# Patient Record
Sex: Female | Born: 1995 | Race: Black or African American | Hispanic: No | Marital: Single | Smoking: Never smoker
Health system: Southern US, Community
[De-identification: ages and names within clinical notes are randomized; demographics above are authoritative.]

## PROBLEM LIST (undated history)

## (undated) DIAGNOSIS — I10 Essential (primary) hypertension: Secondary | ICD-10-CM

## (undated) DIAGNOSIS — J45909 Unspecified asthma, uncomplicated: Secondary | ICD-10-CM

## (undated) DIAGNOSIS — O24419 Gestational diabetes mellitus in pregnancy, unspecified control: Secondary | ICD-10-CM

## (undated) HISTORY — PX: NO PAST SURGERIES: SHX2092

## (undated) HISTORY — DX: Gestational diabetes mellitus in pregnancy, unspecified control: O24.419

---
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
## 2013-04-26 ENCOUNTER — Emergency Department (HOSPITAL_COMMUNITY)
Admission: EM | Admit: 2013-04-26 | Discharge: 2013-04-26 | Disposition: A | Discharge status: Home / Self Care | Payer: Medicaid Other | Source: Home / Self Care | Attending: Emergency Medicine | Admitting: Emergency Medicine

## 2013-04-26 ENCOUNTER — Emergency Department (INDEPENDENT_AMBULATORY_CARE_PROVIDER_SITE_OTHER): Payer: Medicaid Other

## 2013-04-26 ENCOUNTER — Encounter (HOSPITAL_COMMUNITY): Payer: Self-pay | Admitting: Emergency Medicine

## 2013-04-26 DIAGNOSIS — S62629A Displaced fracture of medial phalanx of unspecified finger, initial encounter for closed fracture: Secondary | ICD-10-CM

## 2013-04-26 DIAGNOSIS — IMO0002 Reserved for concepts with insufficient information to code with codable children: Secondary | ICD-10-CM
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
## 2013-04-26 NOTE — ED Notes (Signed)
C/o left middle finger swollen since Wednesday night. Stated she doesn't remember hitting it or any injury to hand/finger. Any movement causes pain. Pain is a 5/10. States she hasn't taken any medications for the pain, has tried ice with no relief. Written by: Marga Melnick

## 2014-01-08 ENCOUNTER — Encounter (HOSPITAL_COMMUNITY): Payer: Self-pay | Admitting: Family Medicine

## 2014-01-08 ENCOUNTER — Emergency Department (INDEPENDENT_AMBULATORY_CARE_PROVIDER_SITE_OTHER)
Admission: EM | Admit: 2014-01-08 | Discharge: 2014-01-08 | Disposition: A | Discharge status: Home / Self Care | Payer: Self-pay | Source: Home / Self Care | Attending: Family Medicine | Admitting: Family Medicine

## 2014-01-08 DIAGNOSIS — R51 Headache: Secondary | ICD-10-CM

## 2014-01-08 DIAGNOSIS — R519 Headache, unspecified: Secondary | ICD-10-CM

## 2014-01-08 DIAGNOSIS — T148XXA Other injury of unspecified body region, initial encounter: Secondary | ICD-10-CM

## 2014-01-08 MED ORDER — DEXAMETHASONE SODIUM PHOSPHATE 10 MG/ML IJ SOLN
INTRAMUSCULAR | Status: AC
  Filled 2014-01-08: qty 1

## 2014-01-08 MED ORDER — DEXAMETHASONE SODIUM PHOSPHATE 10 MG/ML IJ SOLN
10.0000 mg | Freq: Once | INTRAMUSCULAR | Status: AC
  Administered 2014-01-08: 10 mg via INTRAMUSCULAR

## 2014-01-08 MED ORDER — KETOROLAC TROMETHAMINE 60 MG/2ML IM SOLN
60.0000 mg | Freq: Once | INTRAMUSCULAR | Status: AC
  Administered 2014-01-08: 60 mg via INTRAMUSCULAR

## 2014-01-08 MED ORDER — CYCLOBENZAPRINE HCL 5 MG PO TABS: 5.0000 mg | ORAL_TABLET | Freq: Three times a day (TID) | ORAL | Status: DC | PRN

## 2014-01-08 MED ORDER — KETOROLAC TROMETHAMINE 60 MG/2ML IM SOLN
INTRAMUSCULAR | Status: AC
  Filled 2014-01-08: qty 2
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
## 2015-08-27 ENCOUNTER — Encounter (HOSPITAL_COMMUNITY): Payer: Self-pay | Admitting: Emergency Medicine

## 2015-08-27 ENCOUNTER — Ambulatory Visit (HOSPITAL_COMMUNITY)
Admission: EM | Admit: 2015-08-27 | Discharge: 2015-08-27 | Disposition: A | Discharge status: Home / Self Care | Payer: Medicaid Other | Attending: Emergency Medicine | Admitting: Emergency Medicine

## 2015-08-27 DIAGNOSIS — N939 Abnormal uterine and vaginal bleeding, unspecified: Secondary | ICD-10-CM | POA: Insufficient documentation

## 2015-08-27 DIAGNOSIS — N72 Inflammatory disease of cervix uteri: Secondary | ICD-10-CM | POA: Insufficient documentation

## 2015-08-27 DIAGNOSIS — N760 Acute vaginitis: Secondary | ICD-10-CM | POA: Insufficient documentation

## 2015-08-27 DIAGNOSIS — N898 Other specified noninflammatory disorders of vagina: Secondary | ICD-10-CM

## 2015-08-27 DIAGNOSIS — L309 Dermatitis, unspecified: Secondary | ICD-10-CM | POA: Insufficient documentation

## 2015-08-27 LAB — POCT URINALYSIS DIP (DEVICE)
BILIRUBIN URINE: NEGATIVE
GLUCOSE, UA: NEGATIVE mg/dL
Hgb urine dipstick: NEGATIVE
Ketones, ur: NEGATIVE mg/dL
NITRITE: NEGATIVE
PH: 7 (ref 5.0–8.0)
PROTEIN: NEGATIVE mg/dL
Specific Gravity, Urine: 1.025 (ref 1.005–1.030)
Urobilinogen, UA: 1 mg/dL (ref 0.0–1.0)

## 2015-08-27 LAB — POCT PREGNANCY, URINE: PREG TEST UR: NEGATIVE

## 2015-08-27 MED ORDER — TRIAMCINOLONE ACETONIDE 0.1 % EX CREA: 1.0000 "application " | TOPICAL_CREAM | Freq: Two times a day (BID) | CUTANEOUS | Status: DC

## 2015-08-27 MED ORDER — CEFTRIAXONE SODIUM 250 MG IJ SOLR
INTRAMUSCULAR | Status: AC
  Filled 2015-08-27: qty 250

## 2015-08-27 MED ORDER — AZITHROMYCIN 250 MG PO TABS
ORAL_TABLET | ORAL | Status: AC
  Filled 2015-08-27: qty 4

## 2015-08-27 MED ORDER — FLUTICASONE PROPIONATE 50 MCG/ACT NA SUSP: 2.0000 | Freq: Every day | NASAL | Status: DC

## 2015-08-27 MED ORDER — METRONIDAZOLE 500 MG PO TABS: 500.0000 mg | ORAL_TABLET | Freq: Two times a day (BID) | ORAL | Status: DC

## 2015-08-27 MED ORDER — BENZONATATE 100 MG PO CAPS: 100.0000 mg | ORAL_CAPSULE | Freq: Three times a day (TID) | ORAL | Status: DC

## 2015-08-27 MED ORDER — CEFTRIAXONE SODIUM 250 MG IJ SOLR
250.0000 mg | Freq: Once | INTRAMUSCULAR | Status: AC
  Administered 2015-08-27: 250 mg via INTRAMUSCULAR

## 2015-08-27 MED ORDER — AZITHROMYCIN 250 MG PO TABS
1000.0000 mg | ORAL_TABLET | Freq: Once | ORAL | Status: AC
  Administered 2015-08-27: 1000 mg via ORAL

## 2015-08-27 NOTE — ED Provider Notes (Signed)
CSN: [id number]     Arrival date & time 08/27/15  1738 History   First MD Initiated Contact with Patient 08/27/15 1933     Chief Complaint  Patient presents with  . Rash  . Vaginal Discharge  . Vaginal Bleeding   (Consider location/radiation/quality/duration/timing/severity/associated sxs/prior Treatment) HPI Comments: 20 year old female is complaining of an itchy red, rough rash over the upper eyelids and behind the ears. This started several days ago. She states while having intercourse last week that the condom slipped off and due to concern of possible pregnancy she ingested a plan B tablet 5 days ago.she is now having mild vaginal bleeding/spotting. Denies pelvic pain. Complaining of a malodorous vaginal discharge. Last menstrual period 2 weeks ago.   Past Medical History  Diagnosis Date  . Asthma    History reviewed. No pertinent past surgical history. History reviewed. No pertinent family history. Social History  Substance Use Topics  . Smoking status: Never Smoker   . Smokeless tobacco: None  . Alcohol Use: No   OB History    No data available     Review of Systems  Constitutional: Negative for fever, chills, activity change, appetite change and fatigue.  HENT: Positive for congestion, postnasal drip and rhinorrhea. Negative for facial swelling.   Eyes: Negative.   Respiratory: Negative.   Cardiovascular: Negative.   Genitourinary: Positive for vaginal bleeding, vaginal discharge and menstrual problem. Negative for dysuria, frequency and pelvic pain.  Musculoskeletal: Negative.  Negative for neck pain and neck stiffness.  Skin: Negative for pallor and rash.  Neurological: Negative.   All other systems reviewed and are negative.   Allergies  Pollen extract  Home Medications   Prior to Admission medications   Medication Sig Start Date End Date Taking? Authorizing Provider  hydrochlorothiazide (HYDRODIURIL) 12.5 MG tablet Take 12.5 mg by mouth daily.   Yes  Historical Provider, MD  levonorgestrel (PLAN B,NEXT CHOICE) 0.75 MG tablet Take 0.75 mg by mouth every 12 (twelve) hours.   Yes Historical Provider, MD  budesonide (RHINOCORT AQUA) 32 MCG/ACT nasal spray Place 1 spray into the nose 2 (two) times daily. 08/24/12   Linna HoffJames D Kindl, MD  cyclobenzaprine (FLEXERIL) 5 MG tablet Take 1 tablet (5 mg total) by mouth 3 (three) times daily as needed for muscle spasms. 01/08/14   Ozella Rocksavid J Merrell, MD  fexofenadine (ALLEGRA) 180 MG tablet Take 1 tablet (180 mg total) by mouth daily. 08/24/12   Linna HoffJames D Kindl, MD  LORATADINE PO Take by mouth.    Historical Provider, MD  metroNIDAZOLE (FLAGYL) 500 MG tablet Take 1 tablet (500 mg total) by mouth 2 (two) times daily. X 7 days 08/27/15   Hayden Rasmussenavid Yuna Pizzolato, NP  triamcinolone cream (KENALOG) 0.1 % Apply 1 application topically 2 (two) times daily. 08/27/15   Hayden Rasmussenavid Lumen Brinlee, NP   Meds Ordered and Administered this Visit   Medications  azithromycin (ZITHROMAX) tablet 1,000 mg (not administered)  cefTRIAXone (ROCEPHIN) injection 250 mg (not administered)    BP 153/89 mmHg  Pulse 94  Temp(Src) 98.4 F (36.9 C) (Oral)  Resp 16  SpO2 100%  LMP 08/06/2015 (Exact Date) No data found.   Physical Exam  Constitutional: She is oriented to person, place, and time. She appears well-developed and well-nourished. No distress.  HENT:  Bilateral TMs are normal. Oropharynx with minor erythema and clear PND.  Eyes: EOM are normal.  Neck: Normal range of motion. Neck supple.  Cardiovascular: Normal rate and normal heart sounds.   Pulmonary/Chest: Effort normal.  No respiratory distress.  Genitourinary:  Normal external female genitalia There is mild amount of bleeding coating the vaginal walls and the cervix. Ectocervix is pink and smooth. No visible lesions. No separate vaginal discharge is observed, likely masked by the bleeding. Positive for CMT and bilateral adnexal tenderness.  Musculoskeletal: She exhibits no edema.   Lymphadenopathy:    She has no cervical adenopathy.  Neurological: She is alert and oriented to person, place, and time. She exhibits normal muscle tone.  Skin: Skin is warm and dry.  There is a minor light erythema rash with a rough texture to the upper eyelids and behind the ears.  Psychiatric: She has a normal mood and affect.  Nursing note and vitals reviewed.   ED Course  Procedures (including critical care time)  Labs Review Labs Reviewed  POCT URINALYSIS DIP (DEVICE) - Abnormal; Notable for the following:    Leukocytes, UA SMALL (*)    All other components within normal limits  POCT PREGNANCY, URINE  CERVICOVAGINAL ANCILLARY ONLY   Results for orders placed or performed during the hospital encounter of 08/27/15  POCT urinalysis dip (device)  Result Value Ref Range   Glucose, UA NEGATIVE NEGATIVE mg/dL   Bilirubin Urine NEGATIVE NEGATIVE   Ketones, ur NEGATIVE NEGATIVE mg/dL   Specific Gravity, Urine 1.025 1.005 - 1.030   Hgb urine dipstick NEGATIVE NEGATIVE   pH 7.0 5.0 - 8.0   Protein, ur NEGATIVE NEGATIVE mg/dL   Urobilinogen, UA 1.0 0.0 - 1.0 mg/dL   Nitrite NEGATIVE NEGATIVE   Leukocytes, UA SMALL (A) NEGATIVE  Pregnancy, urine POC  Result Value Ref Range   Preg Test, Ur NEGATIVE NEGATIVE     Imaging Review No results found.   Visual Acuity Review  Right Eye Distance:   Left Eye Distance:   Bilateral Distance:    Right Eye Near:   Left Eye Near:    Bilateral Near:         MDM   1. Eczema   2. Vaginal discharge   3. Vaginal bleeding   4. Cervicitis   5. Vaginitis    Meds ordered this encounter  Medications  . hydrochlorothiazide (HYDRODIURIL) 12.5 MG tablet    Sig: Take 12.5 mg by mouth daily.  Marland Kitchen levonorgestrel (PLAN B,NEXT CHOICE) 0.75 MG tablet    Sig: Take 0.75 mg by mouth every 12 (twelve) hours.  Marland Kitchen azithromycin (ZITHROMAX) tablet 1,000 mg    Sig:   . cefTRIAXone (ROCEPHIN) injection 250 mg    Sig:   . metroNIDAZOLE (FLAGYL)  500 MG tablet    Sig: Take 1 tablet (500 mg total) by mouth 2 (two) times daily. X 7 days    Dispense:  14 tablet    Refill:  0    Order Specific Question:  Supervising Provider    Answer:  Charm Rings [id number]  . triamcinolone cream (KENALOG) 0.1 %    Sig: Apply 1 application topically 2 (two) times daily.    Dispense:  15 g    Refill:  0    Order Specific Question:  Supervising Provider    Answer:  Micheline Chapman       Hayden Rasmussen, NP 08/27/15 2152

## 2015-08-27 NOTE — ED Notes (Signed)
Patient advised no issues after injection.

## 2015-08-27 NOTE — Discharge Instructions (Signed)
Abnormal Uterine Bleeding The abnormal bleeding is likely due to the plan B medication you took recently. This bleeding should taper off in a few days. We have obtained swabs of the fluid in your vagina. If the test result comes back positive and you have not been previously treated at the urgent care we will call you and treat you over the telephone. For worsening or new symptoms or problems follow-up your primary care doctor or go to the [facility]. Abnormal uterine bleeding can affect women at various stages in life, including teenagers, women in their reproductive years, pregnant women, and women who have reached menopause. Several kinds of uterine bleeding are considered abnormal, including:  Bleeding or spotting between periods.   Bleeding after sexual intercourse.   Bleeding that is heavier or more than normal.   Periods that last longer than usual.  Bleeding after menopause.  Many cases of abnormal uterine bleeding are minor and simple to treat, while others are more serious. Any type of abnormal bleeding should be evaluated by your health care provider. Treatment will depend on the cause of the bleeding. HOME CARE INSTRUCTIONS Monitor your condition for any changes. The following actions may help to alleviate any discomfort you are experiencing:  Avoid the use of tampons and douches as directed by your health care provider.  Change your pads frequently. You should get regular pelvic exams and Pap tests. Keep all follow-up appointments for diagnostic tests as directed by your health care provider.  SEEK MEDICAL CARE IF:   Your bleeding lasts more than 1 week.   You feel dizzy at times.  SEEK IMMEDIATE MEDICAL CARE IF:   You pass out.   You are changing pads every 15 to 30 minutes.   You have abdominal pain.  You have a fever.   You become sweaty or weak.   You are passing large blood clots from the vagina.   You start to feel nauseous and  vomit. MAKE SURE YOU:   Understand these instructions.  Will watch your condition.  Will get help right away if you are not doing well or get worse.   This information is not intended to replace advice given to you by your health care provider. Make sure you discuss any questions you have with your health care provider.   Document Released: 04/18/2005 Document Revised: 04/23/2013 Document Reviewed: 11/15/2012 Elsevier Interactive Patient Education 2016 Elsevier Inc.  Bacterial Vaginosis Bacterial vaginosis is an infection of the vagina. It happens when too many germs (bacteria) grow in the vagina. Having this infection puts you at risk for getting other infections from sex. Treating this infection can help lower your risk for other infections, such as:   Chlamydia.  Gonorrhea.  HIV.  Herpes. HOME CARE  Take your medicine as told by your doctor.  Finish your medicine even if you start to feel better.  Tell your sex partner that you have an infection. They should see their doctor for treatment.  During treatment:  Avoid sex or use condoms correctly.  Do not douche.  Do not drink alcohol unless your doctor tells you it is ok.  Do not breastfeed unless your doctor tells you it is ok. GET HELP IF:  You are not getting better after 3 days of treatment.  You have more grey fluid (discharge) coming from your vagina than before.  You have more pain than before.  You have a fever. MAKE SURE YOU:   Understand these instructions.  Will watch  your condition.  Will get help right away if you are not doing well or get worse.   This information is not intended to replace advice given to you by your health care provider. Make sure you discuss any questions you have with your health care provider.   Document Released: 01/26/2008 Document Revised: 05/09/2014 Document Reviewed: 11/28/2012 Elsevier Interactive Patient Education 2016 Elsevier Inc.  Cervicitis Cervicitis is  a soreness and swelling (inflammation) of the cervix. Your cervix is located at the bottom of your uterus. It opens up to the vagina. CAUSES   Sexually transmitted infections (STIs).   Allergic reaction.   Medicines or birth control devices that are put in the vagina.   Injury to the cervix.   Bacterial infections.  RISK FACTORS You are at greater risk if you:  Have unprotected sexual intercourse.  Have sexual intercourse with many partners.  Began sexual intercourse at an early age.  Have a history of STIs. SYMPTOMS  There may be no symptoms. If symptoms occur, they may include:   Gray, white, yellow, or bad-smelling vaginal discharge.   Pain or itching of the area outside the vagina.   Painful sexual intercourse.   Lower abdominal or lower back pain, especially during intercourse.   Frequent urination.   Abnormal vaginal bleeding between periods, after sexual intercourse, or after menopause.   Pressure or a heavy feeling in the pelvis.  DIAGNOSIS  Diagnosis is made after a pelvic exam. Other tests may include:   Examination of any discharge under a microscope (wet prep).   A Pap test.  TREATMENT  Treatment will depend on the cause of cervicitis. If it is caused by an STI, both you and your partner will need to be treated. Antibiotic medicines will be given.  HOME CARE INSTRUCTIONS   Do not have sexual intercourse until your health care provider says it is okay.   Do not have sexual intercourse until your partner has been treated, if your cervicitis is caused by an STI.   Take your antibiotics as directed. Finish them even if you start to feel better.  SEEK MEDICAL CARE IF:  Your symptoms come back.   You have a fever.  MAKE SURE YOU:   Understand these instructions.  Will watch your condition.  Will get help right away if you are not doing well or get worse.   This information is not intended to replace advice given to you by your  health care provider. Make sure you discuss any questions you have with your health care provider.   Document Released: 04/18/2005 Document Revised: 04/23/2013 Document Reviewed: 10/10/2012 Elsevier Interactive Patient Education 2016 Elsevier Inc.  Pelvic Inflammatory Disease Pelvic inflammatory disease (PID) is an infection in some or all of the female organs. PID can be in the uterus, ovaries, fallopian tubes, or the surrounding tissues that are inside the lower belly area (pelvis). PID can lead to lasting problems if it is not treated. To check for this disease, your doctor may:  Do a physical exam.  Do blood tests, urine tests, or a pregnancy test.  Look at your vaginal discharge.  Do tests to look inside the pelvis.  Test you for other infections. HOME CARE  Take over-the-counter and prescription medicines only as told by your doctor.  If you were prescribed an antibiotic medicine, take it as told by your doctor. Do not stop taking it even if you start to feel better.  Do not have sex until treatment is done  or as told by your doctor.  Tell your sex partner if you have PID. Your partner may need to be treated.  Keep all follow-up visits as told by your doctor. This is important.  Your doctor may test you for infection again 3 months after you are treated. GET HELP IF:  You have more fluid (discharge) coming from your vagina or fluid that is not normal.  Your pain does not improve.  You throw up (vomit).  You have a fever.  You cannot take your medicines.  Your partner has a sexually transmitted disease (STD).  You have pain when you pee (urinate). GET HELP RIGHT AWAY IF:  You have more belly (abdominal) or lower belly pain.  You have chills.  You are not better after 72 hours.   This information is not intended to replace advice given to you by your health care provider. Make sure you discuss any questions you have with your health care provider.     Document Released: 07/15/2008 Document Revised: 01/07/2015 Document Reviewed: 05/26/2014 Elsevier Interactive Patient Education 2016 [employer].  Vaginitis Vaginitis is an inflammation of the vagina. It is most often caused by a change in the normal balance of the bacteria and yeast that live in the vagina. This change in balance causes an overgrowth of certain bacteria or yeast, which causes the inflammation. There are different types of vaginitis, but the most common types are:  Bacterial vaginosis.  Yeast infection (candidiasis).  Trichomoniasis vaginitis. This is a sexually transmitted infection (STI).  Viral vaginitis.  Atrophic vaginitis.  Allergic vaginitis. CAUSES  The cause depends on the type of vaginitis. Vaginitis can be caused by:  Bacteria (bacterial vaginosis).  Yeast (yeast infection).  A parasite (trichomoniasis vaginitis)  A virus (viral vaginitis).  Low hormone levels (atrophic vaginitis). Low hormone levels can occur during pregnancy, breastfeeding, or after menopause.  Irritants, such as bubble baths, scented tampons, and feminine sprays (allergic vaginitis). Other factors can change the normal balance of the yeast and bacteria that live in the vagina. These include:  Antibiotic medicines.  Poor hygiene.  Diaphragms, vaginal sponges, spermicides, birth control pills, and intrauterine devices (IUD).  Sexual intercourse.  Infection.  Uncontrolled diabetes.  A weakened immune system. SYMPTOMS  Symptoms can vary depending on the cause of the vaginitis. Common symptoms include:  Abnormal vaginal discharge.  The discharge is white, gray, or yellow with bacterial vaginosis.  The discharge is thick, white, and cheesy with a yeast infection.  The discharge is frothy and yellow or greenish with trichomoniasis.  A bad vaginal odor.  The odor is fishy with bacterial vaginosis.  Vaginal itching, pain, or swelling.  Painful  intercourse.  Pain or burning when urinating. Sometimes, there are no symptoms. TREATMENT  Treatment will vary depending on the type of infection.   Bacterial vaginosis and trichomoniasis are often treated with antibiotic creams or pills.  Yeast infections are often treated with antifungal medicines, such as vaginal creams or suppositories.  Viral vaginitis has no cure, but symptoms can be treated with medicines that relieve discomfort. Your sexual partner should be treated as well.  Atrophic vaginitis may be treated with an estrogen cream, pill, suppository, or vaginal ring. If vaginal dryness occurs, lubricants and moisturizing creams may help. You may be told to avoid scented soaps, sprays, or douches.  Allergic vaginitis treatment involves quitting the use of the product that is causing the problem. Vaginal creams can be used to treat the symptoms. HOME CARE INSTRUCTIONS  Take all medicines as directed by your caregiver.  Keep your genital area clean and dry. Avoid soap and only rinse the area with water.  Avoid douching. It can remove the healthy bacteria in the vagina.  Do not use tampons or have sexual intercourse until your vaginitis has been treated. Use sanitary pads while you have vaginitis.  Wipe from front to back. This avoids the spread of bacteria from the rectum to the vagina.  Let air reach your genital area.  Wear cotton underwear to decrease moisture buildup.  Avoid wearing underwear while you sleep until your vaginitis is gone.  Avoid tight pants and underwear or nylons without a cotton panel.  Take off wet clothing (especially bathing suits) as soon as possible.  Use mild, non-scented products. Avoid using irritants, such as:  Scented feminine sprays.  Fabric softeners.  Scented detergents.  Scented tampons.  Scented soaps or bubble baths.  Practice safe sex and use condoms. Condoms may prevent the spread of trichomoniasis and viral  vaginitis. SEEK MEDICAL CARE IF:   You have abdominal pain.  You have a fever or persistent symptoms for more than 2-3 days.  You have a fever and your symptoms suddenly get worse.   This information is not intended to replace advice given to you by your health care provider. Make sure you discuss any questions you have with your health care provider.   Document Released: 02/13/2007 Document Revised: 09/02/2014 Document Reviewed: 09/29/2011 Elsevier Interactive Patient Education [employer]2016 Elsevier Inc.

## 2015-08-27 NOTE — ED Notes (Signed)
Patient placed in the lobby for a 20 minute observation after injection.

## 2015-08-27 NOTE — ED Notes (Addendum)
[facility] with a complaint of a rash behind her ears that she thinks to be eczema and a vaginal discharge with odor and some spotting x 1 month. The patient did state that she took a Plan B pill 4 days ago.

## 2015-08-28 LAB — CERVICOVAGINAL ANCILLARY ONLY
Chlamydia: NEGATIVE
Neisseria Gonorrhea: NEGATIVE

## 2015-08-31 LAB — CERVICOVAGINAL ANCILLARY ONLY: WET PREP (BD AFFIRM): POSITIVE — AB

## 2015-09-05 ENCOUNTER — Telehealth (HOSPITAL_COMMUNITY): Payer: Self-pay | Admitting: Emergency Medicine

## 2015-09-05 NOTE — ED Notes (Signed)
Called pt and notified of recent lab results from visit 04/27 Pt ID'd properly... Reports feeling better and sx have subsided... She's on her cycle but states she feels fine  Has finished Flagyl   Per Dr. Dayton ScrapeMurray,  Tests for gonorrhea/chlamydia were negative. Test for gardnerella (bacterial vaginosis) was positive.  [facility]  Recheck for further evaluation if symptoms persist. LM  Adv pt if sx are not getting better to return  Pt verb understanding Education on safe sex given

## 2016-01-27 ENCOUNTER — Encounter: Payer: Self-pay | Admitting: Obstetrics and Gynecology

## 2016-01-27 ENCOUNTER — Ambulatory Visit (INDEPENDENT_AMBULATORY_CARE_PROVIDER_SITE_OTHER): Payer: BLUE CROSS/BLUE SHIELD | Admitting: Obstetrics and Gynecology

## 2016-01-27 VITALS — BP 148/91 | HR 101 | Temp 99.6°F | Ht 64.0 in | Wt 177.2 lb

## 2016-01-27 DIAGNOSIS — Z01419 Encounter for gynecological examination (general) (routine) without abnormal findings: Secondary | ICD-10-CM

## 2016-01-27 NOTE — Progress Notes (Signed)
Patient is in office concerned about discharge that has odor that comes and goes. Patient states that it burns a little when she pees.

## 2016-01-27 NOTE — Progress Notes (Signed)
Subjective:     Tara Chase is a 20 y.o. female G0 who is here for a comprehensive physical exam. The patient reports the presence of a vaginal odor a few weeks ago. She is sexually active using condoms occasionally. She is undecided on contraception but is considering Mirena IUD. She reports regular five day cycles. She denies any abdominal pain or abnormal discharge  Past Medical History:  Diagnosis Date  . Asthma    No past surgical history on file. No family history on file.  Social History   Social History  . Marital status: Single    Spouse name: N/A  . Number of children: N/A  . Years of education: N/A   Occupational History  . Not on file.   Social History Main Topics  . Smoking status: Never Smoker  . Smokeless tobacco: Not on file  . Alcohol use No  . Drug use: No  . Sexual activity: Not on file   Other Topics Concern  . Not on file   Social History Narrative  . No narrative on file   Health Maintenance  Topic Date Due  . HIV Screening  04/15/2009  . PAP SMEAR  04/16/2015  . INFLUENZA VACCINE  12/01/2015  . TETANUS/TDAP  05/25/2022       Review of Systems Pertinent items are noted in HPI.   Objective:  Blood pressure (!) 148/91, pulse (!) 101, temperature 99.6 F (37.6 C), temperature source Oral, height 5\' 4"  (1.626 m), weight 177 lb 3.2 oz (80.4 kg), last menstrual period 01/15/2016.     GENERAL: Well-developed, well-nourished female in no acute distress.  HEENT: Normocephalic, atraumatic. Sclerae anicteric.  NECK: Supple. Normal thyroid.  LUNGS: Clear to auscultation bilaterally.  HEART: Regular rate and rhythm. BREASTS: Symmetric in size. No palpable masses or lymphadenopathy, skin changes, or nipple drainage. ABDOMEN: Soft, nontender, nondistended. No organomegaly. PELVIC: Normal external female genitalia. Vagina is pink and rugated.  Normal discharge. Normal appearing cervix. Uterus is normal in size.  No adnexal mass or  tenderness. EXTREMITIES: No cyanosis, clubbing, or edema, 2+ distal pulses.    Assessment:    Healthy female exam.      Plan:    pap smear collected Cultures and wet prep collected Patient will be contacted with any abnormal results Encouraged the use of condoms for STD prevention See After Visit Summary for Counseling Recommendations

## 2016-01-30 LAB — NUSWAB VG+, CANDIDA 6SP
CANDIDA ALBICANS, NAA: POSITIVE — AB
CANDIDA PARAPSILOSIS, NAA: NEGATIVE
CHLAMYDIA TRACHOMATIS, NAA: NEGATIVE
Candida glabrata, NAA: NEGATIVE
Candida krusei, NAA: NEGATIVE
Candida lusitaniae, NAA: NEGATIVE
Candida tropicalis, NAA: NEGATIVE
NEISSERIA GONORRHOEAE, NAA: NEGATIVE
TRICH VAG BY NAA: POSITIVE — AB

## 2016-02-01 MED ORDER — METRONIDAZOLE 500 MG PO TABS: 500.0000 mg | ORAL_TABLET | Freq: Two times a day (BID) | ORAL | 0 refills | Status: AC

## 2016-02-01 NOTE — Addendum Note (Signed)
Addended by: Catalina AntiguaONSTANT, Mirella Gueye on: 02/01/2016 09:38 AM   Modules accepted: Orders

## 2016-02-02 ENCOUNTER — Encounter: Payer: Self-pay | Admitting: Obstetrics and Gynecology

## 2016-02-02 DIAGNOSIS — R87612 Low grade squamous intraepithelial lesion on cytologic smear of cervix (LGSIL): Secondary | ICD-10-CM | POA: Insufficient documentation

## 2016-02-02 LAB — PAP IG W/ RFLX HPV ASCU: PAP SMEAR COMMENT: 0

## 2016-02-03 ENCOUNTER — Telehealth: Payer: Self-pay | Admitting: *Deleted

## 2016-02-03 NOTE — Telephone Encounter (Signed)
-----   Message from Catalina AntiguaPeggy Constant, MD sent at 02/01/2016  9:38 AM EDT ----- Please inform patient of BV and trich infection. Rx has been e-prescribed Her partner needs to be informed and treated for Trich infection. They should both abstain until they both complete treatment  Thanks  Peggy

## 2016-02-03 NOTE — Telephone Encounter (Signed)
Tara AntiguaPeggy Constant, MD  [facility]        Please inform patient of abnormal pap smear. No interventions are needed this year but she needs to have another pap smear next year.   If not already received, encourage her to get the gardasil vaccine   Thanks   Peggy    Left message for patient to call for results

## 2016-02-03 NOTE — Telephone Encounter (Signed)
Patient notified of her results 

## 2016-02-16 ENCOUNTER — Ambulatory Visit: Payer: Self-pay | Admitting: Certified Nurse Midwife

## 2016-07-22 ENCOUNTER — Emergency Department (HOSPITAL_COMMUNITY): Payer: BLUE CROSS/BLUE SHIELD

## 2016-07-22 ENCOUNTER — Encounter (HOSPITAL_COMMUNITY): Payer: Self-pay | Admitting: Emergency Medicine

## 2016-07-22 ENCOUNTER — Emergency Department (HOSPITAL_COMMUNITY)
Admission: EM | Admit: 2016-07-22 | Discharge: 2016-07-22 | Disposition: A | Discharge status: Home / Self Care | Payer: BLUE CROSS/BLUE SHIELD | Attending: Emergency Medicine | Admitting: Emergency Medicine

## 2016-07-22 DIAGNOSIS — R1030 Lower abdominal pain, unspecified: Secondary | ICD-10-CM | POA: Insufficient documentation

## 2016-07-22 DIAGNOSIS — R0602 Shortness of breath: Secondary | ICD-10-CM | POA: Diagnosis not present

## 2016-07-22 DIAGNOSIS — Z79899 Other long term (current) drug therapy: Secondary | ICD-10-CM | POA: Insufficient documentation

## 2016-07-22 DIAGNOSIS — J45909 Unspecified asthma, uncomplicated: Secondary | ICD-10-CM | POA: Insufficient documentation

## 2016-07-22 DIAGNOSIS — R109 Unspecified abdominal pain: Secondary | ICD-10-CM

## 2016-07-22 DIAGNOSIS — I10 Essential (primary) hypertension: Secondary | ICD-10-CM | POA: Insufficient documentation

## 2016-07-22 HISTORY — DX: Essential (primary) hypertension: I10

## 2016-07-22 LAB — CBC
HCT: 39.3 % (ref 36.0–46.0)
HEMOGLOBIN: 13.1 g/dL (ref 12.0–15.0)
MCH: 31.1 pg (ref 26.0–34.0)
MCHC: 33.3 g/dL (ref 30.0–36.0)
MCV: 93.3 fL (ref 78.0–100.0)
Platelets: 288 10*3/uL (ref 150–400)
RBC: 4.21 MIL/uL (ref 3.87–5.11)
RDW: 12.6 % (ref 11.5–15.5)
WBC: 9.5 10*3/uL (ref 4.0–10.5)

## 2016-07-22 LAB — COMPREHENSIVE METABOLIC PANEL
ALT: 13 U/L — ABNORMAL LOW (ref 14–54)
ANION GAP: 5 (ref 5–15)
AST: 19 U/L (ref 15–41)
Albumin: 4.1 g/dL (ref 3.5–5.0)
Alkaline Phosphatase: 74 U/L (ref 38–126)
BUN: 11 mg/dL (ref 6–20)
CALCIUM: 9.2 mg/dL (ref 8.9–10.3)
CHLORIDE: 106 mmol/L (ref 101–111)
CO2: 26 mmol/L (ref 22–32)
Creatinine, Ser: 0.86 mg/dL (ref 0.44–1.00)
GFR calc Af Amer: >60 mL/min (ref >60)
Glucose, Bld: 91 mg/dL (ref 65–99)
Potassium: 3.7 mmol/L (ref 3.5–5.1)
SODIUM: 137 mmol/L (ref 135–145)
Total Bilirubin: 0.7 mg/dL (ref 0.3–1.2)
Total Protein: 8.2 g/dL — ABNORMAL HIGH (ref 6.5–8.1)

## 2016-07-22 LAB — URINALYSIS, ROUTINE W REFLEX MICROSCOPIC
Bilirubin Urine: NEGATIVE
GLUCOSE, UA: NEGATIVE mg/dL
KETONES UR: NEGATIVE mg/dL
LEUKOCYTES UA: NEGATIVE
Nitrite: NEGATIVE
PROTEIN: NEGATIVE mg/dL
Specific Gravity, Urine: 1.015 (ref 1.005–1.030)
pH: 7 (ref 5.0–8.0)

## 2016-07-22 LAB — LIPASE, BLOOD: LIPASE: 19 U/L (ref 11–51)

## 2016-07-22 LAB — PROTIME-INR
INR: 1.03
Prothrombin Time: 13.5 seconds (ref 11.4–15.2)

## 2016-07-22 LAB — I-STAT BETA HCG BLOOD, ED (MC, WL, AP ONLY)

## 2016-07-22 LAB — I-STAT TROPONIN, ED: TROPONIN I, POC: 0 ng/mL (ref 0.00–0.08)

## 2016-07-22 LAB — D-DIMER, QUANTITATIVE: D-Dimer, Quant: <0.27 ug/mL-FEU (ref 0.00–0.50)

## 2016-07-22 LAB — I-STAT CG4 LACTIC ACID, ED: Lactic Acid, Venous: 0.69 mmol/L (ref 0.5–1.9)

## 2016-07-22 MED ORDER — HYDROCODONE-ACETAMINOPHEN 5-325 MG PO TABS
1.0000 | ORAL_TABLET | Freq: Once | ORAL | Status: AC
  Administered 2016-07-22: 1 via ORAL
  Filled 2016-07-22: qty 1

## 2016-07-22 MED ORDER — HYDROCODONE-ACETAMINOPHEN 5-325 MG PO TABS: 1.0000 | ORAL_TABLET | Freq: Four times a day (QID) | ORAL | 0 refills | Status: DC | PRN

## 2016-07-22 NOTE — ED Triage Notes (Signed)
Patient reports shortness of breath since January after a upper respiratory infection along with a productive cough with white/clear mucous. Patient also reports menstrual period irregularity, accompanied by lower abdominal cramping.

## 2016-07-22 NOTE — ED Provider Notes (Signed)
[facility] Provider Note   CSN: [id number] Arrival date & time: 07/22/16  1057     History   Chief Complaint Chief Complaint  Patient presents with  . URI  . Abdominal Pain    HPI Tara Chase is a 21 y.o. female with a past medical history significant for hypertension and asthma who presents with a two-month history of mild shortness of breath, a productive cough for the last week, and some lower abdominal cramping associated with menstrual cycle. Patient reports that for the last 2 months, since January, she was having some shortness of breath. She is unsure if it is related to seasonal changes or allergies. She says that she has had no chest pain. She says over the last week or so, she has had worsening productive cough that has had a yellow/clear sputum. She denies fevers or chills with this. She reports that her cough has been waking her up occasionally. She says that she has had some lower abdominal cramping across her lower abdomen. There is no nausea, vomiting, conservation, diarrhea or dysuria. She says that she has had irregular menstrual cycles for the last few months and she switched off of her birth control medications. She says that the cramping is similar to prior menstrual cycle cramping and she denies any vaginal discharge. She has no history of STI. She has no pelvic pain. She denies flank pain or CVA pain. She denies any other symptoms on arrival. She has no history of family CAD and no history of DVT or PE in herself. No lower extremity pain or swelling.     The history is provided by the patient. No language interpreter was used.  Shortness of Breath  This is a new problem. The average episode lasts 2 months. The problem occurs intermittently.The problem has not changed since onset.Associated symptoms include rhinorrhea, cough, sputum production and abdominal pain. Pertinent negatives include no fever, no headaches, no neck pain, no hemoptysis, no wheezing,  no chest pain, no syncope, no vomiting, no rash, no leg pain and no leg swelling. She has tried beta-agonist inhalers for the symptoms. The treatment provided moderate relief. Associated medical issues include asthma. Associated medical issues do not include PE, CAD, heart failure, past MI or DVT.    Past Medical History:  Diagnosis Date  . Asthma   . Hypertension     Patient Active Problem List   Diagnosis Date Noted  . Low grade squamous intraepithelial lesion (LGSIL) on cervical Pap smear 02/02/2016    History reviewed. No pertinent surgical history.  OB History    No data available       Home Medications    Prior to Admission medications   Medication Sig Start Date End Date Taking? Authorizing Provider  albuterol (PROVENTIL HFA;VENTOLIN HFA) 108 (90 Base) MCG/ACT inhaler Inhale 1-2 puffs into the lungs every 6 (six) hours as needed for wheezing or shortness of breath.   Yes Historical Provider, MD  Biotin 5 MG TABS Take 1 tablet by mouth daily.   Yes Historical Provider, MD  lisinopril-hydrochlorothiazide (PRINZIDE,ZESTORETIC) 20-12.5 MG tablet Take 1 tablet by mouth daily.   Yes Historical Provider, MD  Multiple Vitamin (MULTIVITAMIN WITH MINERALS) TABS tablet Take 1 tablet by mouth daily.   Yes Historical Provider, MD    Family History Family History  Problem Relation Age of Onset  . Cancer Paternal Grandfather   . Hypertension Father   . Stroke Father   . Hypertension Mother     Social  History Social History  Substance Use Topics  . Smoking status: Never Smoker  . Smokeless tobacco: Never Used  . Alcohol use No     Allergies   Pollen extract   Review of Systems Review of Systems  Constitutional: Negative for activity change, chills, diaphoresis, fatigue and fever.  HENT: Positive for rhinorrhea. Negative for congestion.   Eyes: Negative for visual disturbance.  Respiratory: Positive for cough, sputum production and shortness of breath. Negative for  hemoptysis, wheezing and stridor.   Cardiovascular: Negative for chest pain, palpitations, leg swelling and syncope.  Gastrointestinal: Positive for abdominal pain. Negative for abdominal distention, blood in stool, constipation, diarrhea, nausea and vomiting.  Genitourinary: Positive for vaginal bleeding (on menstrual cycle). Negative for difficulty urinating, dysuria, flank pain, frequency, hematuria, menstrual problem, pelvic pain, urgency and vaginal discharge.  Musculoskeletal: Negative for back pain and neck pain.  Skin: Negative for rash and wound.  Neurological: Negative for dizziness, weakness, light-headedness, numbness and headaches.  Psychiatric/Behavioral: Negative for agitation and confusion.  All other systems reviewed and are negative.    Physical Exam Updated Vital Signs BP (!) 156/86 (BP Location: Right Arm)   Pulse 94   Temp 98.9 F (37.2 C) (Oral)   Resp 12   Ht 5\' 5"  (1.651 m)   Wt 180 lb (81.6 kg)   LMP 07/22/2016   SpO2 100%   BMI 29.95 kg/m   Physical Exam  Constitutional: She is oriented to person, place, and time. She appears well-developed and well-nourished. No distress.  HENT:  Head: Normocephalic and atraumatic.  Right Ear: External ear normal.  Left Ear: External ear normal.  Nose: Nose normal.  Mouth/Throat: Oropharynx is clear and moist. No oropharyngeal exudate.  Eyes: Conjunctivae and EOM are normal. Pupils are equal, round, and reactive to light.  Neck: Normal range of motion. Neck supple.  Cardiovascular: Normal rate, normal heart sounds and intact distal pulses.   No murmur heard. Pulmonary/Chest: Effort normal and breath sounds normal. No stridor. No respiratory distress. She has no wheezes. She has no rales. She exhibits no tenderness.  Abdominal: Soft. She exhibits no distension. There is no tenderness. There is no rebound.  Genitourinary:  Genitourinary Comments: Pelvic exam offered and pt refused.   Neurological: She is alert and  oriented to person, place, and time. She has normal reflexes. She exhibits normal muscle tone. Coordination normal.  Skin: Skin is warm. Capillary refill takes less than 2 seconds. No rash noted. She is not diaphoretic. No erythema.  Psychiatric: She has a normal mood and affect.  Nursing note and vitals reviewed.    ED Treatments / Results  Labs (all labs ordered are listed, but only abnormal results are displayed) Labs Reviewed  COMPREHENSIVE METABOLIC PANEL - Abnormal; Notable for the following:       Result Value   Total Protein 8.2 (*)    ALT 13 (*)    All other components within normal limits  URINALYSIS, ROUTINE W REFLEX MICROSCOPIC - Abnormal; Notable for the following:    Hgb urine dipstick MODERATE (*)    Bacteria, UA RARE (*)    Squamous Epithelial / LPF 0-5 (*)    All other components within normal limits  LIPASE, BLOOD  CBC  PROTIME-INR  D-DIMER, QUANTITATIVE (NOT AT [facility])  I-STAT BETA HCG BLOOD, ED ([facility], [facility], [facility] ONLY)  I-STAT CG4 LACTIC ACID, ED  I-STAT TROPOININ, ED  I-STAT CG4 LACTIC ACID, ED    EKG  EKG Interpretation  Date/Time:  Friday  July 22 2016 13:01:19 EDT Ventricular Rate:  70 PR Interval:    QRS Duration: 98 QT Interval:  385 QTC Calculation: 416 R Axis:   86 Text Interpretation:  Sinus rhythm No STEMI Confirmed by Rush Landmark MD, Walda Hertzog [phone]) on 07/22/2016 3:12:17 PM       Radiology Dg Chest 2 View  Result Date: 07/22/2016 CLINICAL DATA:  Cough for 2 months.  Hypertension. EXAM: CHEST  2 VIEW COMPARISON:  None. FINDINGS: Lungs are clear. Heart size and pulmonary vascularity are normal. No adenopathy. No bone lesions. IMPRESSION: No edema or consolidation. Electronically Signed   By: Bretta Bang III M.D.   On: 07/22/2016 12:56    Procedures Procedures (including critical care time)  Medications Ordered in ED Medications  HYDROcodone-acetaminophen (NORCO/VICODIN) 5-325 MG per tablet 1 tablet (1 tablet Oral Given 07/22/16 1329)      Initial Impression / Assessment and Plan / ED Course  I have reviewed the triage vital signs and the nursing notes.  Pertinent labs & imaging results that were available during my care of the patient were reviewed by me and considered in my medical decision making (see chart for details).     Tara Chase is a 22 y.o. female with a past medical history significant for hypertension and asthma who presents with a two-month history of mild shortness of breath, a productive cough for the last week, and some lower abdominal cramping associated with menstrual cycle.  History and exam are seen above.   On exam, patient has no lower extremity tenderness or swelling. No abdominal tenderness on exam. Specifically, no right lower quadrant tenderness. No CVA tenderness. Lungs are clear with no wheezing. Patient reports using her albuterol inhaler prior to coming to the ED. She has no chest tenderness.  Based on patient's report of shortness of breath with cough, x-ray obtained to look for pneumonia. No evidence of infection was seen. With the shortness of breath she has been having and her recent change in birth control, d-dimer was ordered to look for DVT or PE. D-dimer negative. Doubt embolus. Lactic acid unremarkable, troponin negative, and INR is normal. Urinalysis showed no evidence of UTI and patient had no urinary symptoms. Patient is not pregnant. Lipase normal.   Chest x-ray shows no evidence of pneumonia. EKG showed no evidence of acute ischemia.  Patient was counseled that lower abdominal cramping and pain in the abdomen could be worked up with pelvic exam and swabs however, she did not want to do this at this time. Patient was given a pain pill which improved her discomfort and it was completely resolved.   Suspect patient's cramping is from her menstrual cycle that is very irregular due to medication changes. Suspect the patient's asthma had flared up causing her shortness of breath.  URI likely the cause of her cough. No evidence of pneumonia.  Patient will be given prescription for pain medication and instructed to follow-up with her PCP in the next several days. Patient understood strict return precautions for new or worsening symptoms. Do not feel patient has a cardiac etiology of her discomfort. Patient had no other questions or concerns and was discharged in good condition with resolution of abdominal pain and shortness of breath.    Final Clinical Impressions(s) / ED Diagnoses   Final diagnoses:  Shortness of breath  Abdominal cramping    New Prescriptions Discharge Medication List as of 07/22/2016  3:47 PM    START taking these medications   Details  HYDROcodone-acetaminophen (  NORCO/VICODIN) 5-325 MG tablet Take 1 tablet by mouth every 6 (six) hours as needed., Starting Fri 07/22/2016, Print        Clinical Impression: 1. Shortness of breath   2. Abdominal cramping     Disposition: Discharge  Condition: Good  I have discussed the results, Dx and Tx plan with the pt(& family if present). He/she/they expressed understanding and agree(s) with the plan. Discharge instructions discussed at great length. Strict return precautions discussed and pt &/or family have verbalized understanding of the instructions. No further questions at time of discharge.    Discharge Medication List as of 07/22/2016  3:47 PM    START taking these medications   Details  HYDROcodone-acetaminophen (NORCO/VICODIN) 5-325 MG tablet Take 1 tablet by mouth every 6 (six) hours as needed., Starting Fri 07/22/2016, Print        [facility] [address] [place] [id number] [phone]       Heide Scaleshristopher J Latravis Grine, MD 07/22/16 2014

## 2016-07-22 NOTE — Discharge Instructions (Signed)
Please schedule an appointment to follow-up with your primary care physician in the next several days for recheck of your discomfort. These take pain medicine as needed for the discomfort. If he began having a new or worsened symptoms, please return to the nearest emergency department.

## 2017-11-30 ENCOUNTER — Encounter (HOSPITAL_COMMUNITY): Payer: Self-pay | Admitting: *Deleted

## 2017-11-30 ENCOUNTER — Ambulatory Visit (HOSPITAL_COMMUNITY)
Admission: EM | Admit: 2017-11-30 | Discharge: 2017-11-30 | Disposition: A | Discharge status: Home / Self Care | Payer: BLUE CROSS/BLUE SHIELD | Attending: Family Medicine | Admitting: Family Medicine

## 2017-11-30 ENCOUNTER — Ambulatory Visit (INDEPENDENT_AMBULATORY_CARE_PROVIDER_SITE_OTHER): Payer: BLUE CROSS/BLUE SHIELD

## 2017-11-30 ENCOUNTER — Other Ambulatory Visit: Payer: Self-pay

## 2017-11-30 DIAGNOSIS — M7989 Other specified soft tissue disorders: Secondary | ICD-10-CM

## 2017-11-30 MED ORDER — NAPROXEN 500 MG PO TABS: 500.0000 mg | ORAL_TABLET | Freq: Two times a day (BID) | ORAL | 0 refills | Status: DC

## 2017-11-30 NOTE — ED Provider Notes (Signed)
[facility]    CSN: [id number] Arrival date & time: 11/30/17  1705     History   Chief Complaint Chief Complaint  Patient presents with  . Hand Pain    HPI Tara Chase is a 22 y.o. female.   Tara Chase presents with complaints of pain and swelling to mtp joints of right hand to pointer and middle finger. Noticed yesterday. No known injury. She is right handed. States has happened in the past, has been months, and resolved with wrapping it. Has not wrapped it. Pain 7/10. Worse with touch or movement. Tried applying ice as well as biofreeze. No numbness or tingling. No history of gout or kidney stones. Does take lisinopril/hctz.    ROS per HPI.      Past Medical History:  Diagnosis Date  . Asthma   . Hypertension     Patient Active Problem List   Diagnosis Date Noted  . Low grade squamous intraepithelial lesion (LGSIL) on cervical Pap smear 02/02/2016    History reviewed. No pertinent surgical history.  OB History   None      Home Medications    Prior to Admission medications   Medication Sig Start Date End Date Taking? Authorizing Provider  albuterol (PROVENTIL HFA;VENTOLIN HFA) 108 (90 Base) MCG/ACT inhaler Inhale 1-2 puffs into the lungs every 6 (six) hours as needed for wheezing or shortness of breath.    [provider]  Biotin 5 MG TABS Take 1 tablet by mouth daily.    [provider]  HYDROcodone-acetaminophen (NORCO/VICODIN) 5-325 MG tablet Take 1 tablet by mouth every 6 (six) hours as needed. 07/22/16   Tegeler, Canary Brimhristopher J, MD  lisinopril-hydrochlorothiazide (PRINZIDE,ZESTORETIC) 20-12.5 MG tablet Take 1 tablet by mouth daily.    [provider]  Multiple Vitamin (MULTIVITAMIN WITH MINERALS) TABS tablet Take 1 tablet by mouth daily.    [provider]  naproxen (NAPROSYN) 500 MG tablet Take 1 tablet (500 mg total) by mouth 2 (two) times daily. 11/30/17   Georgetta HaberBurky, Maksym Pfiffner B, NP    Family History Family  History  Problem Relation Age of Onset  . Cancer Paternal Grandfather   . Hypertension Father   . Stroke Father   . Hypertension Mother     Social History Social History   Tobacco Use  . Smoking status: Never Smoker  . Smokeless tobacco: Never Used  Substance Use Topics  . Alcohol use: No  . Drug use: No     Allergies   Pollen extract   Review of Systems Review of Systems   Physical Exam Triage Vital Signs ED Triage Vitals  Enc Vitals Group     BP 11/30/17 1722 (!) 158/96     Pulse Rate 11/30/17 1722 (!) 102     Resp 11/30/17 1722 14     Temp 11/30/17 1722 98.8 F (37.1 C)     Temp Source 11/30/17 1722 Oral     SpO2 11/30/17 1722 95 %     Weight --      Height --      Head Circumference --      Peak Flow --      Pain Score 11/30/17 1723 7     Pain Loc --      Pain Edu? --      Excl. in GC? --    No data found.  Updated Vital Signs BP (!) 158/96 (BP Location: Right Arm)   Pulse (!) 102   Temp 98.8 F (37.1  C) (Oral)   Resp 14   LMP 11/19/2017 (Approximate)   SpO2 95%    Physical Exam  Constitutional: She is oriented to person, place, and time. She appears well-developed and well-nourished. No distress.  Cardiovascular: Normal rate, regular rhythm and normal heart sounds.  Pulmonary/Chest: Effort normal and breath sounds normal.  Musculoskeletal:       Right wrist: Normal.       Right hand: She exhibits tenderness, bony tenderness and swelling. She exhibits normal range of motion, normal two-point discrimination, normal capillary refill, no deformity and no laceration. Normal sensation noted. Normal strength noted.       Hands: Slightly red and swollen with tenderness to pointer and ring MTP joints with pain with ROM; sensation intact, strong radial pulse; cap refill < 2 seconds ; no pain to other fingers or hand, no skin breakdown or lesions, no rash; wrist WNL   Neurological: She is alert and oriented to person, place, and time.  Skin: Skin is  warm and dry.     UC Treatments / Results  Labs (all labs ordered are listed, but only abnormal results are displayed) Labs Reviewed - No data to display  EKG None  Radiology Dg Hand Complete Right  Result Date: 11/30/2017 CLINICAL DATA:  Pain and swelling to the second digit EXAM: RIGHT HAND - COMPLETE 3+ VIEW COMPARISON:  None. FINDINGS: There is no evidence of fracture or dislocation. There is no evidence of arthropathy or other focal bone abnormality. Soft tissues are unremarkable. IMPRESSION: Negative. Electronically Signed   By: Jasmine Pang M.D.   On: 11/30/2017 17:57    Procedures Procedures (including critical care time)  Medications Ordered in UC Medications - No data to display  Initial Impression / Assessment and Plan / UC Course  I have reviewed the triage vital signs and the nursing notes.  Pertinent labs & imaging results that were available during my care of the patient were reviewed by me and considered in my medical decision making (see chart for details).     Xray reassuring today. No known injury. No indication of infection at this time. Appears inflammatory in nature. Gout vs arthritis considered. Will treat with naproxen, ice, elevate. May use ACE as needed. Encouraged establish with PCP as may need further evaluation into symptoms if they continue to recur. Return precautions provided. Patient verbalized understanding and agreeable to plan.    Final Clinical Impressions(s) / UC Diagnoses   Final diagnoses:  Swelling of right hand     Discharge Instructions     Xray is normal today.  Question arthritis type inflammatory process.  Will try naproxen twice a day, take with food, to help with pain and swelling.  Ice application.  May use a wrap as needed for comfort.  Please establish with a primary care provider as may need further evaluation or recheck if symptoms continue to recur.     ED Prescriptions    Medication Sig Dispense Auth. Provider     naproxen (NAPROSYN) 500 MG tablet Take 1 tablet (500 mg total) by mouth 2 (two) times daily. 30 tablet Georgetta Haber, NP     Controlled Substance Prescriptions [place] Controlled Substance Registry consulted? Not Applicable   Georgetta Haber, NP 11/30/17 [phone]

## 2017-11-30 NOTE — Discharge Instructions (Signed)
Xray is normal today.  Question arthritis type inflammatory process.  Will try naproxen twice a day, take with food, to help with pain and swelling.  Ice application.  May use a wrap as needed for comfort.  Please establish with a primary care provider as may need further evaluation or recheck if symptoms continue to recur.

## 2017-11-30 NOTE — ED Triage Notes (Signed)
C/o pain and swelling in right hand onset yest. Denies injury

## 2019-02-13 ENCOUNTER — Emergency Department (HOSPITAL_COMMUNITY)
Admission: EM | Admit: 2019-02-13 | Discharge: 2019-02-13 | Disposition: A | Discharge status: Home / Self Care | Payer: No Typology Code available for payment source | Attending: Emergency Medicine | Admitting: Emergency Medicine

## 2019-02-13 ENCOUNTER — Other Ambulatory Visit: Payer: Self-pay

## 2019-02-13 ENCOUNTER — Emergency Department (HOSPITAL_COMMUNITY): Payer: No Typology Code available for payment source

## 2019-02-13 ENCOUNTER — Encounter (HOSPITAL_COMMUNITY): Payer: Self-pay

## 2019-02-13 DIAGNOSIS — Z79899 Other long term (current) drug therapy: Secondary | ICD-10-CM | POA: Insufficient documentation

## 2019-02-13 DIAGNOSIS — R109 Unspecified abdominal pain: Secondary | ICD-10-CM | POA: Insufficient documentation

## 2019-02-13 DIAGNOSIS — I10 Essential (primary) hypertension: Secondary | ICD-10-CM | POA: Insufficient documentation

## 2019-02-13 DIAGNOSIS — J45909 Unspecified asthma, uncomplicated: Secondary | ICD-10-CM | POA: Insufficient documentation

## 2019-02-13 LAB — I-STAT BETA HCG BLOOD, ED (MC, WL, AP ONLY): I-stat hCG, quantitative: <5 m[IU]/mL (ref <5)

## 2019-02-13 LAB — I-STAT CHEM 8, ED
BUN: 12 mg/dL (ref 6–20)
Calcium, Ion: 1.17 mmol/L (ref 1.15–1.40)
Chloride: 103 mmol/L (ref 98–111)
Creatinine, Ser: 0.9 mg/dL (ref 0.44–1.00)
Glucose, Bld: 95 mg/dL (ref 70–99)
HCT: 40 % (ref 36.0–46.0)
Hemoglobin: 13.6 g/dL (ref 12.0–15.0)
Potassium: 3.6 mmol/L (ref 3.5–5.1)
Sodium: 142 mmol/L (ref 135–145)
TCO2: 25 mmol/L (ref 22–32)

## 2019-02-13 LAB — CBC WITH DIFFERENTIAL/PLATELET
Abs Immature Granulocytes: 0.04 10*3/uL (ref 0.00–0.07)
Basophils Absolute: 0.1 10*3/uL (ref 0.0–0.1)
Basophils Relative: 1 %
Eosinophils Absolute: 0.1 10*3/uL (ref 0.0–0.5)
Eosinophils Relative: 1 %
HCT: 39.3 % (ref 36.0–46.0)
Hemoglobin: 12.8 g/dL (ref 12.0–15.0)
Immature Granulocytes: 0 %
Lymphocytes Relative: 28 %
Lymphs Abs: 3.7 10*3/uL (ref 0.7–4.0)
MCH: 30.9 pg (ref 26.0–34.0)
MCHC: 32.6 g/dL (ref 30.0–36.0)
MCV: 94.9 fL (ref 80.0–100.0)
Monocytes Absolute: 1.1 10*3/uL — ABNORMAL HIGH (ref 0.1–1.0)
Monocytes Relative: 8 %
Neutro Abs: 8.4 10*3/uL — ABNORMAL HIGH (ref 1.7–7.7)
Neutrophils Relative %: 62 %
Platelets: 301 10*3/uL (ref 150–400)
RBC: 4.14 MIL/uL (ref 3.87–5.11)
RDW: 12.5 % (ref 11.5–15.5)
WBC: 13.5 10*3/uL — ABNORMAL HIGH (ref 4.0–10.5)
nRBC: 0 % (ref 0.0–0.2)

## 2019-02-13 LAB — RAPID URINE DRUG SCREEN, HOSP PERFORMED
Amphetamines: NOT DETECTED
Barbiturates: NOT DETECTED
Benzodiazepines: NOT DETECTED
Cocaine: NOT DETECTED
Opiates: NOT DETECTED
Tetrahydrocannabinol: NOT DETECTED

## 2019-02-13 MED ORDER — IOHEXOL 300 MG/ML  SOLN
100.0000 mL | Freq: Once | INTRAMUSCULAR | Status: AC | PRN
  Administered 2019-02-13: 100 mL via INTRAVENOUS

## 2019-02-13 MED ORDER — SODIUM CHLORIDE (PF) 0.9 % IJ SOLN
INTRAMUSCULAR | Status: AC
  Filled 2019-02-13: qty 50

## 2019-02-13 MED ORDER — METHOCARBAMOL 500 MG PO TABS: 500.0000 mg | ORAL_TABLET | Freq: Two times a day (BID) | ORAL | 0 refills | Status: DC

## 2019-02-13 MED ORDER — ACETAMINOPHEN 500 MG PO TABS
1000.0000 mg | ORAL_TABLET | Freq: Once | ORAL | Status: AC
  Administered 2019-02-13: 1000 mg via ORAL
  Filled 2019-02-13: qty 2

## 2019-02-13 MED ORDER — IBUPROFEN 800 MG PO TABS: 800.0000 mg | ORAL_TABLET | Freq: Three times a day (TID) | ORAL | 0 refills | Status: DC

## 2019-02-13 MED ORDER — ALUM & MAG HYDROXIDE-SIMETH 200-200-20 MG/5ML PO SUSP
30.0000 mL | Freq: Once | ORAL | Status: AC
  Administered 2019-02-13: 30 mL via ORAL
  Filled 2019-02-13: qty 30

## 2019-02-13 MED ORDER — KETOROLAC TROMETHAMINE 30 MG/ML IJ SOLN
15.0000 mg | Freq: Once | INTRAMUSCULAR | Status: AC
  Administered 2019-02-13: 15 mg via INTRAVENOUS
  Filled 2019-02-13: qty 1
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
## 2019-05-06 ENCOUNTER — Ambulatory Visit: Payer: BLUE CROSS/BLUE SHIELD | Admitting: Women's Health

## 2019-05-14 ENCOUNTER — Other Ambulatory Visit: Payer: Self-pay

## 2019-05-14 ENCOUNTER — Ambulatory Visit (INDEPENDENT_AMBULATORY_CARE_PROVIDER_SITE_OTHER): Payer: 59 | Admitting: Women's Health

## 2019-05-14 ENCOUNTER — Encounter: Payer: Self-pay | Admitting: Women's Health

## 2019-05-14 VITALS — BP 130/82 | Ht 64.0 in | Wt 179.0 lb

## 2019-05-14 DIAGNOSIS — Z01419 Encounter for gynecological examination (general) (routine) without abnormal findings: Secondary | ICD-10-CM | POA: Diagnosis not present

## 2019-05-14 DIAGNOSIS — Z113 Encounter for screening for infections with a predominantly sexual mode of transmission: Secondary | ICD-10-CM

## 2019-05-14 DIAGNOSIS — N898 Other specified noninflammatory disorders of vagina: Secondary | ICD-10-CM | POA: Diagnosis not present

## 2019-05-14 LAB — WET PREP FOR TRICH, YEAST, CLUE

## 2019-05-14 MED ORDER — METRONIDAZOLE 500 MG PO TABS: 500.0000 mg | ORAL_TABLET | Freq: Two times a day (BID) | ORAL | 0 refills | Status: DC

## 2019-05-14 NOTE — Progress Notes (Signed)
Tara Chase Jul 12, 1995 [id number]    History:  24 yo SBF G0 presents for annual exam with mild vaginal itching x1 week. Regular monthly 5 day cycles. New partner encounter in October 2020 with no contraception, requests STD screen. Pap 2017 LGSIL without previous office follow-up.. Interested in weight neutral contraceptive. No Gardasil. Medical problems include HTN, pre-diabetes, asthma managed by PCP.   Past medical history, past surgical history, family history and social history were all reviewed and documented in the EPIC chart.  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  Mother, father, sister have HTN. Grandmother has diabetes.   ROS:  A ROS was performed and pertinent positives and negatives are included.  Exam:  Vitals:   05/14/19 1545  BP: 130/82  Weight: 179 lb (81.2 kg)  Height: 5\' 4"  (1.626 m)   Body mass index is 30.73 kg/m.   General appearance:  Normal Thyroid:  Symmetrical, normal in size, without palpable masses or nodularity. Respiratory  Auscultation:  Clear without wheezing or rhonchi Cardiovascular  Auscultation:  Regular rate, without rubs, murmurs or gallops  Edema/varicosities:  Not grossly evident Abdominal  Soft,nontender, without masses, guarding or rebound.  Liver/spleen:  No organomegaly noted  Hernia:  None appreciated  Skin  Inspection:  Grossly normal   Breasts: Examined lying and sitting.     Right: Without masses, retractions, discharge or axillary adenopathy.     Left: Without masses, retractions, discharge or axillary adenopathy. Gentitourinary   Inguinal/mons:  Normal without inguinal adenopathy  External genitalia:  Normal  BUS/Urethra/Skene's glands:  Normal  Vagina:  Scant discharge. Wet prep positive for clue cells and TNTC bacteria.   Cervix:  Normal  Uterus:  normal in size, shape and contour.  Midline and mobile  Adnexa/parametria:     Rt: Without masses or tenderness.   Lt: Without masses or tenderness.  Anus  and perineum: Normal    Assessment/Plan:  24 y.o. SBF G0  for annual exam and mild vaginal irritation/itching.  Regular monthly cycle-No contraception STD screen  HTN, pre-diabetes, asthma- management/labs with PCP Bacterial Vaginosis  Plan: Metronidazole 500mg  BID x7 days, alcohol precautions reviewed.  Instructed to call if continued problems with discharge.  Pap, GC/chlamydia, RPR, HIV. Reviewed contraceptive options,  if decides to proceed with Mirena IUD will call to schedule while on cycle.  IUD risks of infection, perforation, hemorrhage reviewed.  Informed about Gardasil, will consider for next visit. Aware of need to increase cardio exercise, decrease calories/carbs, DASH diet. Encouraged MVI and SBE.   22 [facility], 4:50 PM 05/14/2019

## 2019-05-14 NOTE — Patient Instructions (Signed)
Nice to meet you! MVI daily Human Papillomavirus Quadrivalent Vaccine suspension for injection What is this medicine? HUMAN PAPILLOMAVIRUS VACCINE (HYOO muhn pap uh LOH muh vahy ruhs vak SEEN) is a vaccine. It is used to prevent infections of four types of the human papillomavirus. In women, the vaccine may lower your risk of getting cervical, vaginal, vulvar, or anal cancer and genital warts. In men, the vaccine may lower your risk of getting genital warts and anal cancer. You cannot get these diseases from the vaccine. This vaccine does not treat these diseases. This medicine may be used for other purposes; ask your health care provider or pharmacist if you have questions. COMMON BRAND NAME(S): Gardasil What should I tell my health care provider before I take this medicine? They need to know if you have any of these conditions:  fever or infection  hemophilia  HIV infection or AIDS  immune system problems  low platelet count  an unusual reaction to Human Papillomavirus Vaccine, yeast, other medicines, foods, dyes, or preservatives  pregnant or trying to get pregnant  breast-feeding How should I use this medicine? This vaccine is for injection in a muscle on your upper arm or thigh. It is given by a health care professional. Dennis Bast will be observed for 15 minutes after each dose. Sometimes, fainting happens after the vaccine is given. You may be asked to sit or lie down during the 15 minutes. Three doses are given. The second dose is given 2 months after the first dose. The last dose is given 4 months after the second dose. A copy of a Vaccine Information Statement will be given before each vaccination. Read this sheet carefully each time. The sheet may change frequently. Talk to your pediatrician regarding the use of this medicine in children. While this drug may be prescribed for children as Dezhane Staten as 26 years of age for selected conditions, precautions do apply. Overdosage: If you think  you have taken too much of this medicine contact a poison control center or emergency room at once. NOTE: This medicine is only for you. Do not share this medicine with others. What if I miss a dose? All 3 doses of the vaccine should be given within 6 months. Remember to keep appointments for follow-up doses. Your health care provider will tell you when to return for the next vaccine. Ask your health care professional for advice if you are unable to keep an appointment or miss a scheduled dose. What may interact with this medicine?  other vaccines This list may not describe all possible interactions. Give your health care provider a list of all the medicines, herbs, non-prescription drugs, or dietary supplements you use. Also tell them if you smoke, drink alcohol, or use illegal drugs. Some items may interact with your medicine. What should I watch for while using this medicine? This vaccine may not fully protect everyone. Continue to have regular pelvic exams and cervical or anal cancer screenings as directed by your doctor. The Human Papillomavirus is a sexually transmitted disease. It can be passed by any kind of sexual activity that involves genital contact. The vaccine works best when given before you have any contact with the virus. Many people who have the virus do not have any signs or symptoms. Tell your doctor or health care professional if you have any reaction or unusual symptom after getting the vaccine. What side effects may I notice from receiving this medicine? Side effects that you should report to your doctor or health  care professional as soon as possible:  allergic reactions like skin rash, itching or hives, swelling of the face, lips, or tongue  breathing problems  feeling faint or lightheaded, falls Side effects that usually do not require medical attention (report to your doctor or health care professional if they continue or are  bothersome):  cough  dizziness  fever  headache  nausea  redness, warmth, swelling, pain, or itching at site where injected This list may not describe all possible side effects. Call your doctor for medical advice about side effects. You may report side effects to FDA at 1-800-FDA-1088. Where should I keep my medicine? This drug is given in a hospital or clinic and will not be stored at home. NOTE: This sheet is a summary. It may not cover all possible information. If you have questions about this medicine, talk to your doctor, pharmacist, or health care provider.  2020 Elsevier/Gold Standard (2013-06-10 13:14:33)  DASH Eating Plan DASH stands for "Dietary Approaches to Stop Hypertension." The DASH eating plan is a healthy eating plan that has been shown to reduce high blood pressure (hypertension). It may also reduce your risk for type 2 diabetes, heart disease, and stroke. The DASH eating plan may also help with weight loss. What are tips for following this plan?  General guidelines  Avoid eating more than 2,300 mg (milligrams) of salt (sodium) a day. If you have hypertension, you may need to reduce your sodium intake to 1,500 mg a day.  Limit alcohol intake to no more than 1 drink a day for nonpregnant women and 2 drinks a day for men. One drink equals 12 oz of beer, 5 oz of wine, or 1 oz of hard liquor.  Work with your health care provider to maintain a healthy body weight or to lose weight. Ask what an ideal weight is for you.  Get at least 30 minutes of exercise that causes your heart to beat faster (aerobic exercise) most days of the week. Activities may include walking, swimming, or biking.  Work with your health care provider or diet and nutrition specialist (dietitian) to adjust your eating plan to your individual calorie needs. Reading food labels   Check food labels for the amount of sodium per serving. Choose foods with less than 5 percent of the Daily Value of  sodium. Generally, foods with less than 300 mg of sodium per serving fit into this eating plan.  To find whole grains, look for the word "whole" as the first word in the ingredient list. Shopping  Buy products labeled as "low-sodium" or "no salt added."  Buy fresh foods. Avoid canned foods and premade or frozen meals. Cooking  Avoid adding salt when cooking. Use salt-free seasonings or herbs instead of table salt or sea salt. Check with your health care provider or pharmacist before using salt substitutes.  Do not fry foods. Cook foods using healthy methods such as baking, boiling, grilling, and broiling instead.  Cook with heart-healthy oils, such as olive, canola, soybean, or sunflower oil. Meal planning  Eat a balanced diet that includes: ? 5 or more servings of fruits and vegetables each day. At each meal, try to fill half of your plate with fruits and vegetables. ? Up to 6-8 servings of whole grains each day. ? Less than 6 oz of lean meat, poultry, or fish each day. A 3-oz serving of meat is about the same size as a deck of cards. One egg equals 1 oz. ? 2 servings of  low-fat dairy each day. ? A serving of nuts, seeds, or beans 5 times each week. ? Heart-healthy fats. Healthy fats called Omega-3 fatty acids are found in foods such as flaxseeds and coldwater fish, like sardines, salmon, and mackerel.  Limit how much you eat of the following: ? Canned or prepackaged foods. ? Food that is high in trans fat, such as fried foods. ? Food that is high in saturated fat, such as fatty meat. ? Sweets, desserts, sugary drinks, and other foods with added sugar. ? Full-fat dairy products.  Do not salt foods before eating.  Try to eat at least 2 vegetarian meals each week.  Eat more home-cooked food and less restaurant, buffet, and fast food.  When eating at a restaurant, ask that your food be prepared with less salt or no salt, if possible. What foods are recommended? The items listed  may not be a complete list. Talk with your dietitian about what dietary choices are best for you. Grains Whole-grain or whole-wheat bread. Whole-grain or whole-wheat pasta. Brown rice. [place] Morrow. Bulgur. Whole-grain and low-sodium cereals. Pita bread. Low-fat, low-sodium crackers. Whole-wheat flour tortillas. Vegetables Fresh or frozen vegetables (raw, steamed, roasted, or grilled). Low-sodium or reduced-sodium tomato and vegetable juice. Low-sodium or reduced-sodium tomato sauce and tomato paste. Low-sodium or reduced-sodium canned vegetables. Fruits All fresh, dried, or frozen fruit. Canned fruit in natural juice (without added sugar). Meat and other protein foods Skinless chicken or [place]. Ground chicken or [place]. Pork with fat trimmed off. Fish and seafood. Egg whites. Dried beans, peas, or lentils. Unsalted nuts, nut butters, and seeds. Unsalted canned beans. Lean cuts of beef with fat trimmed off. Low-sodium, lean deli meat. Dairy Low-fat (1%) or fat-free (skim) milk. Fat-free, low-fat, or reduced-fat cheeses. Nonfat, low-sodium ricotta or cottage cheese. Low-fat or nonfat yogurt. Low-fat, low-sodium cheese. Fats and oils Soft margarine without trans fats. Vegetable oil. Low-fat, reduced-fat, or light mayonnaise and salad dressings (reduced-sodium). Canola, safflower, olive, soybean, and sunflower oils. Avocado. Seasoning and other foods Herbs. Spices. Seasoning mixes without salt. Unsalted popcorn and pretzels. Fat-free sweets. What foods are not recommended? The items listed may not be a complete list. Talk with your dietitian about what dietary choices are best for you. Grains Baked goods made with fat, such as croissants, muffins, or some breads. Dry pasta or rice meal packs. Vegetables Creamed or fried vegetables. Vegetables in a cheese sauce. Regular canned vegetables (not low-sodium or reduced-sodium). Regular canned tomato sauce and paste (not low-sodium or reduced-sodium).  Regular tomato and vegetable juice (not low-sodium or reduced-sodium). Angie Fava. Olives. Fruits Canned fruit in a light or heavy syrup. Fried fruit. Fruit in cream or butter sauce. Meat and other protein foods Fatty cuts of meat. Ribs. Fried meat. Berniece Salines. Sausage. Bologna and other processed lunch meats. Salami. Fatback. Hotdogs. Bratwurst. Salted nuts and seeds. Canned beans with added salt. Canned or smoked fish. Whole eggs or egg yolks. Chicken or [place] with skin. Dairy Whole or 2% milk, cream, and half-and-half. Whole or full-fat cream cheese. Whole-fat or sweetened yogurt. Full-fat cheese. Nondairy creamers. Whipped toppings. Processed cheese and cheese spreads. Fats and oils Butter. Stick margarine. Lard. Shortening. Ghee. Bacon fat. Tropical oils, such as coconut, palm kernel, or palm oil. Seasoning and other foods Salted popcorn and pretzels. Onion salt, garlic salt, seasoned salt, table salt, and sea salt. Worcestershire sauce. Tartar sauce. Barbecue sauce. Teriyaki sauce. Soy sauce, including reduced-sodium. Steak sauce. Canned and packaged gravies. Fish sauce. Oyster sauce. Cocktail sauce. Horseradish that you  find on the shelf. Ketchup. Mustard. Meat flavorings and tenderizers. Bouillon cubes. Hot sauce and Tabasco sauce. Premade or packaged marinades. Premade or packaged taco seasonings. Relishes. Regular salad dressings. Where to find more information:  National Heart, Lung, and [place]: https://wilson-eaton.com/  American Heart Association: www.heart.org Summary  The DASH eating plan is a healthy eating plan that has been shown to reduce high blood pressure (hypertension). It may also reduce your risk for type 2 diabetes, heart disease, and stroke.  With the DASH eating plan, you should limit salt (sodium) intake to 2,300 mg a day. If you have hypertension, you may need to reduce your sodium intake to 1,500 mg a day.  When on the DASH eating plan, aim to eat more fresh fruits and  vegetables, whole grains, lean proteins, low-fat dairy, and heart-healthy fats.  Work with your health care provider or diet and nutrition specialist (dietitian) to adjust your eating plan to your individual calorie needs. This information is not intended to replace advice given to you by your health care provider. Make sure you discuss any questions you have with your health care provider. Document Revised: 03/31/2017 Document Reviewed: 04/11/2016 Elsevier Patient Education  2020 [place] Maintenance, Female Adopting a healthy lifestyle and getting preventive care are important in promoting health and wellness. Ask your health care provider about:  The right schedule for you to have regular tests and exams.  Things you can do on your own to prevent diseases and keep yourself healthy. What should I know about diet, weight, and exercise? Eat a healthy diet   Eat a diet that includes plenty of vegetables, fruits, low-fat dairy products, and lean protein.  Do not eat a lot of foods that are high in solid fats, added sugars, or sodium. Maintain a healthy weight Body mass index (BMI) is used to identify weight problems. It estimates body fat based on height and weight. Your health care provider can help determine your BMI and help you achieve or maintain a healthy weight. Get regular exercise Get regular exercise. This is one of the most important things you can do for your health. Most adults should:  Exercise for at least 150 minutes each week. The exercise should increase your heart rate and make you sweat (moderate-intensity exercise).  Do strengthening exercises at least twice a week. This is in addition to the moderate-intensity exercise.  Spend less time sitting. Even light physical activity can be beneficial. Watch cholesterol and blood lipids Have your blood tested for lipids and cholesterol at 26 years of age, then have this test every 5 years. Have your cholesterol  levels checked more often if:  Your lipid or cholesterol levels are high.  You are older than 26 years of age.  You are at high risk for heart disease. What should I know about cancer screening? Depending on your health history and family history, you may need to have cancer screening at various ages. This may include screening for:  Breast cancer.  Cervical cancer.  Colorectal cancer.  Skin cancer.  Lung cancer. What should I know about heart disease, diabetes, and high blood pressure? Blood pressure and heart disease  High blood pressure causes heart disease and increases the risk of stroke. This is more likely to develop in people who have high blood pressure readings, are of African descent, or are overweight.  Have your blood pressure checked: ? Every 3-5 years if you are 4-60 years of age. ? Every year if you are  37 years old or older. Diabetes Have regular diabetes screenings. This checks your fasting blood sugar level. Have the screening done:  Once every three years after age 32 if you are at a normal weight and have a low risk for diabetes.  More often and at a younger age if you are overweight or have a high risk for diabetes. What should I know about preventing infection? Hepatitis B If you have a higher risk for hepatitis B, you should be screened for this virus. Talk with your health care provider to find out if you are at risk for hepatitis B infection. Hepatitis C Testing is recommended for:  Everyone born from [birth year] through 1967.  Anyone with known risk factors for hepatitis C. Sexually transmitted infections (STIs)  Get screened for STIs, including gonorrhea and chlamydia, if: ? You are sexually active and are younger than 26 years of age. ? You are older than 26 years of age and your health care provider tells you that you are at risk for this type of infection. ? Your sexual activity has changed since you were last screened, and you are at increased  risk for chlamydia or gonorrhea. Ask your health care provider if you are at risk.  Ask your health care provider about whether you are at high risk for HIV. Your health care provider may recommend a prescription medicine to help prevent HIV infection. If you choose to take medicine to prevent HIV, you should first get tested for HIV. You should then be tested every 3 months for as long as you are taking the medicine. Pregnancy  If you are about to stop having your period (premenopausal) and you may become pregnant, seek counseling before you get pregnant.  Take 400 to 800 micrograms (mcg) of folic acid every day if you become pregnant.  Ask for birth control (contraception) if you want to prevent pregnancy. Osteoporosis and menopause Osteoporosis is a disease in which the bones lose minerals and strength with aging. This can result in bone fractures. If you are 84 years old or older, or if you are at risk for osteoporosis and fractures, ask your health care provider if you should:  Be screened for bone loss.  Take a calcium or vitamin D supplement to lower your risk of fractures.  Be given hormone replacement therapy (HRT) to treat symptoms of menopause. Follow these instructions at home: Lifestyle  Do not use any products that contain nicotine or tobacco, such as cigarettes, e-cigarettes, and chewing tobacco. If you need help quitting, ask your health care provider.  Do not use street drugs.  Do not share needles.  Ask your health care provider for help if you need support or information about quitting drugs. Alcohol use  Do not drink alcohol if: ? Your health care provider tells you not to drink. ? You are pregnant, may be pregnant, or are planning to become pregnant.  If you drink alcohol: ? Limit how much you use to 0-1 drink a day. ? Limit intake if you are breastfeeding.  Be aware of how much alcohol is in your drink. In the [place] one drink equals one 12 oz bottle of beer  (355 mL), one 5 oz glass of wine (148 mL), or one 1 oz glass of hard liquor (44 mL). General instructions  Schedule regular health, dental, and eye exams.  Stay current with your vaccines.  Tell your health care provider if: ? You often feel depressed. ? You have ever been abused  or do not feel safe at home. Summary  Adopting a healthy lifestyle and getting preventive care are important in promoting health and wellness.  Follow your health care provider's instructions about healthy diet, exercising, and getting tested or screened for diseases.  Follow your health care provider's instructions on monitoring your cholesterol and blood pressure. This information is not intended to replace advice given to you by your health care provider. Make sure you discuss any questions you have with your health care provider. Document Revised: 04/11/2018 Document Reviewed: 04/11/2018 Elsevier Patient Education  2020 [employer].

## 2019-05-15 LAB — PAP THINPREP ASCUS RFLX HPV RFLX TYPE
C. trachomatis RNA, TMA: NOT DETECTED
N. gonorrhoeae RNA, TMA: NOT DETECTED

## 2019-05-15 LAB — HIV ANTIBODY (ROUTINE TESTING W REFLEX): HIV 1&2 Ab, 4th Generation: NONREACTIVE

## 2019-05-15 LAB — RPR: RPR Ser Ql: NONREACTIVE

## 2019-06-10 ENCOUNTER — Ambulatory Visit: Payer: 59

## 2019-06-12 ENCOUNTER — Ambulatory Visit (INDEPENDENT_AMBULATORY_CARE_PROVIDER_SITE_OTHER): Payer: 59 | Admitting: *Deleted

## 2019-06-12 ENCOUNTER — Other Ambulatory Visit: Payer: Self-pay

## 2019-06-12 DIAGNOSIS — Z23 Encounter for immunization: Secondary | ICD-10-CM

## 2019-06-22 ENCOUNTER — Other Ambulatory Visit: Payer: Self-pay | Admitting: Women's Health

## 2019-07-11 ENCOUNTER — Other Ambulatory Visit: Payer: Self-pay

## 2019-07-12 ENCOUNTER — Encounter: Payer: Self-pay | Admitting: Obstetrics & Gynecology

## 2019-07-12 ENCOUNTER — Ambulatory Visit (INDEPENDENT_AMBULATORY_CARE_PROVIDER_SITE_OTHER): Payer: 59 | Admitting: Obstetrics & Gynecology

## 2019-07-12 VITALS — BP 134/86

## 2019-07-12 DIAGNOSIS — Z113 Encounter for screening for infections with a predominantly sexual mode of transmission: Secondary | ICD-10-CM

## 2019-07-12 DIAGNOSIS — Z3009 Encounter for other general counseling and advice on contraception: Secondary | ICD-10-CM

## 2019-07-12 DIAGNOSIS — Z7251 High risk heterosexual behavior: Secondary | ICD-10-CM | POA: Diagnosis not present

## 2019-07-12 DIAGNOSIS — N898 Other specified noninflammatory disorders of vagina: Secondary | ICD-10-CM | POA: Diagnosis not present

## 2019-07-12 LAB — WET PREP FOR TRICH, YEAST, CLUE

## 2019-07-12 MED ORDER — TINIDAZOLE 500 MG PO TABS: 1.0000 g | ORAL_TABLET | Freq: Two times a day (BID) | ORAL | 0 refills | Status: AC

## 2019-07-12 NOTE — Progress Notes (Signed)
    RHYDER BRATZ 01/01/1996 [id number]        24 y.o.  G0P0000 Boyfriend x 6 months  RP: Increased vaginal discharge and unprotected IC  HPI: Boyfriend x 6 months, not trustworthy.  Unprotected IC.  Increased vaginal d/c with odor.  No itching.  No pelvic pain.  No fever.  Patient would like full STI w-up.  LMP normal 06/30/2019.     OB History  Gravida Para Term Preterm AB Living  0 0 0 0 0 0  SAB TAB Ectopic Multiple Live Births  0 0 0 0 0    Past medical history,surgical history, problem list, medications, allergies, family history and social history were all reviewed and documented in the EPIC chart.   Directed ROS with pertinent positives and negatives documented in the history of present illness/assessment and plan.  Exam:  Vitals:   07/12/19 1040  BP: 134/86   General appearance:  Normal  Abdomen: Normal  Gynecologic exam: Vulva normal.  Speculum:  Cervix/Vagina normal.  Mild increase in discharge.  Wet prep done.  Gono-Chlam done.  Bimanual exam:  Uterus AV, normal volume, mobile, NT.  Wet prep:  Clue cells present   Assessment/Plan:  24 y.o. G0  1. Unprotected sexual intercourse Last menstrual period normal June 30, 2019.  Serum pregnancy test done today. - hCG, serum, qualitative  2. Vaginal discharge Bacterial vaginosis confirmed by wet prep.  Decision to treat with tinidazole.  No contraindication.  Usage reviewed and prescription sent to pharmacy. - WET PREP FOR TRICH, YEAST, CLUE  3. Screen for STD (sexually transmitted disease) Strict condom use strongly recommended. - HIV antibody (with reflex) - RPR - Hepatitis C Antibody - Hepatitis B Surface AntiGEN - C. trachomatis/N. gonorrhoeae RNA  4. Encounter for other general counseling or advice on contraception Counseling on contraception done.  Would like the progesterone IUD.  Follow-up with insertion.  Other orders - tinidazole (TINDAMAX) 500 MG tablet; Take 2 tablets (1,000 mg total)  by mouth 2 (two) times daily for 2 days.  Genia Del MD, 10:46 AM 07/12/2019

## 2019-07-13 ENCOUNTER — Encounter: Payer: Self-pay | Admitting: Obstetrics & Gynecology

## 2019-07-13 LAB — C. TRACHOMATIS/N. GONORRHOEAE RNA
C. trachomatis RNA, TMA: NOT DETECTED
N. gonorrhoeae RNA, TMA: NOT DETECTED

## 2019-07-13 NOTE — Patient Instructions (Signed)
1. Unprotected sexual intercourse Last menstrual period normal June 30, 2019.  Serum pregnancy test done today. - hCG, serum, qualitative  2. Vaginal discharge Bacterial vaginosis confirmed by wet prep.  Decision to treat with tinidazole.  No contraindication.  Usage reviewed and prescription sent to pharmacy. - WET PREP FOR TRICH, YEAST, CLUE  3. Screen for STD (sexually transmitted disease) Strict condom use strongly recommended. - HIV antibody (with reflex) - RPR - Hepatitis C Antibody - Hepatitis B Surface AntiGEN - C. trachomatis/N. gonorrhoeae RNA  4. Encounter for other general counseling or advice on contraception Counseling on contraception done.  Would like the progesterone IUD.  Follow-up with insertion.  Other orders - tinidazole (TINDAMAX) 500 MG tablet; Take 2 tablets (1,000 mg total) by mouth 2 (two) times daily for 2 days.  Tara Chase, it was a pleasure seeing you today!  I will inform you of your results as soon as they are available.

## 2019-07-15 LAB — HIV ANTIBODY (ROUTINE TESTING W REFLEX): HIV 1&2 Ab, 4th Generation: NONREACTIVE

## 2019-07-15 LAB — HCG, SERUM, QUALITATIVE: Preg, Serum: NEGATIVE

## 2019-07-15 LAB — HEPATITIS C ANTIBODY
Hepatitis C Ab: NONREACTIVE
SIGNAL TO CUT-OFF: 0.03 (ref <1.00)

## 2019-07-15 LAB — RPR: RPR Ser Ql: NONREACTIVE

## 2019-07-15 LAB — HEPATITIS B SURFACE ANTIGEN: Hepatitis B Surface Ag: NONREACTIVE

## 2019-08-12 ENCOUNTER — Ambulatory Visit: Payer: 59 | Admitting: *Deleted

## 2019-08-12 ENCOUNTER — Other Ambulatory Visit: Payer: Self-pay

## 2019-08-12 DIAGNOSIS — Z23 Encounter for immunization: Secondary | ICD-10-CM

## 2019-10-14 ENCOUNTER — Encounter: Payer: Self-pay | Admitting: Nurse Practitioner

## 2019-10-14 ENCOUNTER — Ambulatory Visit (INDEPENDENT_AMBULATORY_CARE_PROVIDER_SITE_OTHER): Payer: 59 | Admitting: Nurse Practitioner

## 2019-10-14 ENCOUNTER — Other Ambulatory Visit: Payer: Self-pay

## 2019-10-14 VITALS — BP 140/80

## 2019-10-14 DIAGNOSIS — N760 Acute vaginitis: Secondary | ICD-10-CM | POA: Diagnosis not present

## 2019-10-14 DIAGNOSIS — B9689 Other specified bacterial agents as the cause of diseases classified elsewhere: Secondary | ICD-10-CM

## 2019-10-14 DIAGNOSIS — Z3009 Encounter for other general counseling and advice on contraception: Secondary | ICD-10-CM

## 2019-10-14 DIAGNOSIS — N898 Other specified noninflammatory disorders of vagina: Secondary | ICD-10-CM | POA: Diagnosis not present

## 2019-10-14 DIAGNOSIS — Z113 Encounter for screening for infections with a predominantly sexual mode of transmission: Secondary | ICD-10-CM

## 2019-10-14 DIAGNOSIS — Z7251 High risk heterosexual behavior: Secondary | ICD-10-CM

## 2019-10-14 LAB — WET PREP FOR TRICH, YEAST, CLUE

## 2019-10-14 MED ORDER — METRONIDAZOLE 500 MG PO TABS: 500.0000 mg | ORAL_TABLET | Freq: Two times a day (BID) | ORAL | 0 refills | Status: DC

## 2019-10-14 NOTE — Progress Notes (Signed)
   Acute Office Visit  Subjective:    Patient ID: Tara Chase, female    DOB: 1995-09-28, 24 y.o.   MRN: [id number]  Chief Complaint  Patient presents with  . Vaginal Discharge    ml  . vaginal odor    HPI 24 year old presents today for vaginal discharge and odor that began 1 week ago after unprotected intercourse. Not on contraception. She would like STD screening today. LMP 09/13/2019.   Review of Systems  Constitutional: Negative.   Gastrointestinal: Negative.   Genitourinary: Positive for vaginal discharge. Negative for dysuria and frequency.       Vaginal odor       Objective:    Physical Exam Constitutional:      Appearance: Normal appearance.  Abdominal:     General: There is no distension.     Tenderness: There is no abdominal tenderness.  Genitourinary:    General: Normal vulva.     Vagina: Vaginal discharge (pink-tinged from menses) present. No erythema.     Cervix: Cervical bleeding present. No cervical motion tenderness.     BP 140/80 (BP Location: Right Arm, Patient Position: Sitting, Cuff Size: Normal)   LMP 09/13/2019  Wt Readings from Last 3 Encounters:  05/14/19 179 lb (81.2 kg)  07/22/16 180 lb (81.6 kg)  01/27/16 177 lb 3.2 oz (80.4 kg)   Wet prep: + clue cells     Assessment & Plan:   Problem List Items Addressed This Visit    None    Visit Diagnoses    Screen for STD (sexually transmitted disease)    -  Primary   Relevant Orders   C. trachomatis/N. gonorrhoeae RNA   HIV Antibody (routine testing w rflx)   RPR   Vaginal discharge       Relevant Orders   WET PREP FOR TRICH, YEAST, CLUE   Bacterial vaginosis       Relevant Medications   metroNIDAZOLE (FLAGYL) 500 MG tablet   Unprotected sexual intercourse       Relevant Orders   C. trachomatis/N. gonorrhoeae RNA   HIV Antibody (routine testing w rflx)   RPR   Encounter for other general counseling or advice on contraception         Plan: Wet prep positive for clue cells.   Flagyl 500 mg twice a day for 7 days with instructions to complete full course of antibiotic and avoid alcohol. Chlamydia/gonorrhea, HIV, RPR today. Education provided on safe sex practices and use of condoms until permanent partner. At visit in March she was interested in IUD but has not made an appointment for insertion yet. LMP 09/13/2019, no UPT today, on menses. Follow up if symptoms worsen or do not improve.      Olivia Mackie [facility], 12:29 PM 10/14/2019

## 2019-10-14 NOTE — Patient Instructions (Signed)
Contraception Choices Contraception, also called birth control, refers to methods or devices that prevent pregnancy. Hormonal methods Contraceptive implant  A contraceptive implant is a thin, plastic tube that contains a hormone. It is inserted into the upper part of the arm. It can remain in place for up to 3 years. Progestin-only injections Progestin-only injections are injections of progestin, a synthetic form of the hormone progesterone. They are given every 3 months by a health care provider. Birth control pills  Birth control pills are pills that contain hormones that prevent pregnancy. They must be taken once a day, preferably at the same time each day. Birth control patch  The birth control patch contains hormones that prevent pregnancy. It is placed on the skin and must be changed once a week for three weeks and removed on the fourth week. A prescription is needed to use this method of contraception. Vaginal ring  A vaginal ring contains hormones that prevent pregnancy. It is placed in the vagina for three weeks and removed on the fourth week. After that, the process is repeated with a new ring. A prescription is needed to use this method of contraception. Emergency contraceptive Emergency contraceptives prevent pregnancy after unprotected sex. They come in pill form and can be taken up to 5 days after sex. They work best the sooner they are taken after having sex. Most emergency contraceptives are available without a prescription. This method should not be used as your only form of birth control. Barrier methods Female condom  A female condom is a thin sheath that is worn over the penis during sex. Condoms keep sperm from going inside a woman's body. They can be used with a spermicide to increase their effectiveness. They should be disposed after a single use. Female condom  A female condom is a soft, loose-fitting sheath that is put into the vagina before sex. The condom keeps sperm  from going inside a woman's body. They should be disposed after a single use. Diaphragm  A diaphragm is a soft, dome-shaped barrier. It is inserted into the vagina before sex, along with a spermicide. The diaphragm blocks sperm from entering the uterus, and the spermicide kills sperm. A diaphragm should be left in the vagina for 6-8 hours after sex and removed within 24 hours. A diaphragm is prescribed and fitted by a health care provider. A diaphragm should be replaced every 1-2 years, after giving birth, after gaining more than 15 lb (6.8 kg), and after pelvic surgery. Cervical cap  A cervical cap is a round, soft latex or plastic cup that fits over the cervix. It is inserted into the vagina before sex, along with spermicide. It blocks sperm from entering the uterus. The cap should be left in place for 6-8 hours after sex and removed within 48 hours. A cervical cap must be prescribed and fitted by a health care provider. It should be replaced every 2 years. Sponge  A sponge is a soft, circular piece of polyurethane foam with spermicide on it. The sponge helps block sperm from entering the uterus, and the spermicide kills sperm. To use it, you make it wet and then insert it into the vagina. It should be inserted before sex, left in for at least 6 hours after sex, and removed and thrown away within 30 hours. Spermicides Spermicides are chemicals that kill or block sperm from entering the cervix and uterus. They can come as a cream, jelly, suppository, foam, or tablet. A spermicide should be inserted into the   vagina with an applicator at least 02-72 minutes before sex to allow time for it to work. The process must be repeated every time you have sex. Spermicides do not require a prescription. Intrauterine contraception Intrauterine device (IUD) An IUD is a T-shaped device that is put in a woman's uterus. There are two types:  Hormone IUD.This type contains progestin, a synthetic form of the hormone  progesterone. This type can stay in place for 3-5 years.  Copper IUD.This type is wrapped in copper wire. It can stay in place for 10 years.  Permanent methods of contraception Female tubal ligation In this method, a woman's fallopian tubes are sealed, tied, or blocked during surgery to prevent eggs from traveling to the uterus. Hysteroscopic sterilization In this method, a small, flexible insert is placed into each fallopian tube. The inserts cause scar tissue to form in the fallopian tubes and block them, so sperm cannot reach an egg. The procedure takes about 3 months to be effective. Another form of birth control must be used during those 3 months. Female sterilization This is a procedure to tie off the tubes that carry sperm (vasectomy). After the procedure, the man can still ejaculate fluid (semen). Natural planning methods Natural family planning In this method, a couple does not have sex on days when the woman could become pregnant. Calendar method This means keeping track of the length of each menstrual cycle, identifying the days when pregnancy can happen, and not having sex on those days. Ovulation method In this method, a couple avoids sex during ovulation. Symptothermal method This method involves not having sex during ovulation. The woman typically checks for ovulation by watching changes in her temperature and in the consistency of cervical mucus. Post-ovulation method In this method, a couple waits to have sex until after ovulation. Summary  Contraception, also called birth control, means methods or devices that prevent pregnancy.  Hormonal methods of contraception include implants, injections, pills, patches, vaginal rings, and emergency contraceptives.  Barrier methods of contraception can include female condoms, female condoms, diaphragms, cervical caps, sponges, and spermicides.  There are two types of IUDs (intrauterine devices). An IUD can be put in a woman's uterus to  prevent pregnancy for 3-5 years.  Permanent sterilization can be done through a procedure for males, females, or both.  Natural family planning methods involve not having sex on days when the woman could become pregnant. This information is not intended to replace advice given to you by your health care provider. Make sure you discuss any questions you have with your health care provider. Document Revised: 04/20/2017 Document Reviewed: 05/21/2016 Elsevier Patient Education  2020 [employer]. Bacterial Vaginosis  Bacterial vaginosis is a vaginal infection that occurs when the normal balance of bacteria in the vagina is disrupted. It results from an overgrowth of certain bacteria. This is the most common vaginal infection among women ages 50-44. Because bacterial vaginosis increases your risk for STIs (sexually transmitted infections), getting treated can help reduce your risk for chlamydia, gonorrhea, herpes, and HIV (human immunodeficiency virus). Treatment is also important for preventing complications in pregnant women, because this condition can cause an early (premature) delivery. What are the causes? This condition is caused by an increase in harmful bacteria that are normally present in small amounts in the vagina. However, the reason that the condition develops is not fully understood. What increases the risk? The following factors may make you more likely to develop this condition:  Having a new sexual partner or multiple  sexual partners.  Having unprotected sex.  Douching.  Having an intrauterine device (IUD).  Smoking.  Drug and alcohol abuse.  Taking certain antibiotic medicines.  Being pregnant. You cannot get bacterial vaginosis from toilet seats, bedding, swimming pools, or contact with objects around you. What are the signs or symptoms? Symptoms of this condition include:  Grey or white vaginal discharge. The discharge can also be watery or foamy.  A fish-like  odor with discharge, especially after sexual intercourse or during menstruation.  Itching in and around the vagina.  Burning or pain with urination. Some women with bacterial vaginosis have no signs or symptoms. How is this diagnosed? This condition is diagnosed based on:  Your medical history.  A physical exam of the vagina.  Testing a sample of vaginal fluid under a microscope to look for a large amount of bad bacteria or abnormal cells. Your health care provider may use a cotton swab or a small wooden spatula to collect the sample. How is this treated? This condition is treated with antibiotics. These may be given as a pill, a vaginal cream, or a medicine that is put into the vagina (suppository). If the condition comes back after treatment, a second round of antibiotics may be needed. Follow these instructions at home: Medicines  Take over-the-counter and prescription medicines only as told by your health care provider.  Take or use your antibiotic as told by your health care provider. Do not stop taking or using the antibiotic even if you start to feel better. General instructions  If you have a female sexual partner, tell her that you have a vaginal infection. She should see her health care provider and be treated if she has symptoms. If you have a female sexual partner, he does not need treatment.  During treatment: ? Avoid sexual activity until you finish treatment. ? Do not douche. ? Avoid alcohol as directed by your health care provider. ? Avoid breastfeeding as directed by your health care provider.  Drink enough water and fluids to keep your urine clear or pale yellow.  Keep the area around your vagina and rectum clean. ? Wash the area daily with warm water. ? Wipe yourself from front to back after using the toilet.  Keep all follow-up visits as told by your health care provider. This is important. How is this prevented?  Do not douche.  Wash the outside of your  vagina with warm water only.  Use protection when having sex. This includes latex condoms and dental dams.  Limit how many sexual partners you have. To help prevent bacterial vaginosis, it is best to have sex with just one partner (monogamous).  Make sure you and your sexual partner are tested for STIs.  Wear cotton or cotton-lined underwear.  Avoid wearing tight pants and pantyhose, especially during summer.  Limit the amount of alcohol that you drink.  Do not use any products that contain nicotine or tobacco, such as cigarettes and e-cigarettes. If you need help quitting, ask your health care provider.  Do not use illegal drugs. Where to find more information  Centers for Disease Control and Prevention: SolutionApps.co.za  American Sexual Health Association (ASHA): www.ashastd.org  U.S. Department of Health and Health and safety inspector, Office on Women's Health: ConventionalMedicines.si or http://www.anderson-williamson.info/ Contact a health care provider if:  Your symptoms do not improve, even after treatment.  You have more discharge or pain when urinating.  You have a fever.  You have pain in your abdomen.  You have pain  during sex.  You have vaginal bleeding between periods. Summary  Bacterial vaginosis is a vaginal infection that occurs when the normal balance of bacteria in the vagina is disrupted.  Because bacterial vaginosis increases your risk for STIs (sexually transmitted infections), getting treated can help reduce your risk for chlamydia, gonorrhea, herpes, and HIV (human immunodeficiency virus). Treatment is also important for preventing complications in pregnant women, because the condition can cause an early (premature) delivery.  This condition is treated with antibiotic medicines. These may be given as a pill, a vaginal cream, or a medicine that is put into the vagina (suppository). This information is not intended to replace advice given to you  by your health care provider. Make sure you discuss any questions you have with your health care provider. Document Revised: 03/31/2017 Document Reviewed: 01/02/2016 Elsevier Patient Education  2020 [employer].

## 2019-10-15 LAB — RPR: RPR Ser Ql: NONREACTIVE

## 2019-10-15 LAB — HIV ANTIBODY (ROUTINE TESTING W REFLEX): HIV 1&2 Ab, 4th Generation: NONREACTIVE

## 2019-10-17 ENCOUNTER — Other Ambulatory Visit: Payer: Self-pay | Admitting: Nurse Practitioner

## 2019-10-17 DIAGNOSIS — A749 Chlamydial infection, unspecified: Secondary | ICD-10-CM

## 2019-10-17 LAB — C. TRACHOMATIS/N. GONORRHOEAE RNA
C. trachomatis RNA, TMA: DETECTED — AB
N. gonorrhoeae RNA, TMA: NOT DETECTED

## 2019-10-17 MED ORDER — AZITHROMYCIN 500 MG PO TABS: 1000.0000 mg | ORAL_TABLET | Freq: Once | ORAL | 0 refills | Status: AC

## 2019-10-25 ENCOUNTER — Telehealth: Payer: Self-pay | Admitting: *Deleted

## 2019-10-25 MED ORDER — FLUCONAZOLE 150 MG PO TABS: 150.0000 mg | ORAL_TABLET | Freq: Once | ORAL | 0 refills | Status: AC

## 2019-10-25 NOTE — Telephone Encounter (Signed)
Patient informed, she preferred Diflucan tablet. Rx sent.

## 2019-10-25 NOTE — Telephone Encounter (Signed)
Patient saw Tiffany on 10/14/19 and was treated for BV infection with Flagyl 500 mg tablet, completed medication. Now has vaginal itching and irritation and white discharge, asked if Rx could be sent to pharmacy to help relieve symptom? Please advise

## 2019-10-25 NOTE — Telephone Encounter (Signed)
If she does not want to try Monistat over-the-counter 7-day course then she can have 1 tablet of Diflucan 150 mg p.o.

## 2019-11-12 ENCOUNTER — Ambulatory Visit (INDEPENDENT_AMBULATORY_CARE_PROVIDER_SITE_OTHER): Payer: 59 | Admitting: Nurse Practitioner

## 2019-11-12 ENCOUNTER — Encounter: Payer: Self-pay | Admitting: Nurse Practitioner

## 2019-11-12 ENCOUNTER — Other Ambulatory Visit: Payer: Self-pay

## 2019-11-12 VITALS — BP 118/76

## 2019-11-12 DIAGNOSIS — Z113 Encounter for screening for infections with a predominantly sexual mode of transmission: Secondary | ICD-10-CM | POA: Diagnosis not present

## 2019-11-12 NOTE — Patient Instructions (Signed)

## 2019-11-12 NOTE — Progress Notes (Signed)
   Acute Office Visit  Subjective:    Patient ID: Tara Chase, female    DOB: [birth year], 24 y.o.   MRN: [id number]   HPI 24 y.o. presents today for test of cure. Positive for chlamydia 10/14/2019 and treated with Azithromycin 1 gm x 1 dose.  Was also treated for bacterial vaginosis at that visit.  After completing course of Flagyl she developed a yeast infection and was treated with a one-time dose of Diflucan.  All symptoms have resolved. She has not been sexually active since diagnosis.    Review of Systems  Constitutional: Negative.   Genitourinary: Negative.        Objective:    Physical Exam Constitutional:      Appearance: Normal appearance.  Genitourinary:    General: Normal vulva.     Vagina: Normal.     Cervix: Normal.     BP 118/76   LMP 10/15/2019  Wt Readings from Last 3 Encounters:  05/14/19 179 lb (81.2 kg)  07/22/16 180 lb (81.6 kg)  01/27/16 177 lb 3.2 oz (80.4 kg)        Assessment & Plan:   Problem List Items Addressed This Visit    None    Visit Diagnoses    Screen for STD (sexually transmitted disease)    -  Primary   Relevant Orders   C. trachomatis/N. gonorrhoeae RNA     Plan: Chlamydia pending. Discussed safe sex practices and condom use until permanent partner. Will call with results. She is agreeable to plan.      Olivia Mackie [facility], 9:16 AM 11/12/2019

## 2019-11-13 LAB — C. TRACHOMATIS/N. GONORRHOEAE RNA
C. trachomatis RNA, TMA: NOT DETECTED
N. gonorrhoeae RNA, TMA: NOT DETECTED

## 2019-12-12 ENCOUNTER — Other Ambulatory Visit: Payer: Self-pay

## 2019-12-12 ENCOUNTER — Ambulatory Visit (INDEPENDENT_AMBULATORY_CARE_PROVIDER_SITE_OTHER): Payer: 59 | Admitting: Gynecology

## 2019-12-12 DIAGNOSIS — Z23 Encounter for immunization: Secondary | ICD-10-CM | POA: Diagnosis not present

## 2020-05-18 ENCOUNTER — Encounter: Payer: 59 | Admitting: Nurse Practitioner

## 2021-01-02 ENCOUNTER — Inpatient Hospital Stay (HOSPITAL_COMMUNITY): Payer: Medicaid Other

## 2021-01-02 ENCOUNTER — Other Ambulatory Visit: Payer: Self-pay

## 2021-01-02 ENCOUNTER — Encounter (HOSPITAL_COMMUNITY): Payer: Self-pay | Admitting: Obstetrics and Gynecology

## 2021-01-02 ENCOUNTER — Inpatient Hospital Stay (HOSPITAL_COMMUNITY)
Admission: AD | Admit: 2021-01-02 | Discharge: 2021-01-02 | Disposition: A | Discharge status: Home / Self Care | Payer: Medicaid Other | Attending: Obstetrics and Gynecology | Admitting: Obstetrics and Gynecology

## 2021-01-02 DIAGNOSIS — Z3A01 Less than 8 weeks gestation of pregnancy: Secondary | ICD-10-CM | POA: Diagnosis not present

## 2021-01-02 DIAGNOSIS — R109 Unspecified abdominal pain: Secondary | ICD-10-CM | POA: Insufficient documentation

## 2021-01-02 DIAGNOSIS — O418X10 Other specified disorders of amniotic fluid and membranes, first trimester, not applicable or unspecified: Secondary | ICD-10-CM | POA: Diagnosis not present

## 2021-01-02 DIAGNOSIS — O26891 Other specified pregnancy related conditions, first trimester: Secondary | ICD-10-CM | POA: Diagnosis not present

## 2021-01-02 DIAGNOSIS — O208 Other hemorrhage in early pregnancy: Secondary | ICD-10-CM | POA: Insufficient documentation

## 2021-01-02 DIAGNOSIS — O468X1 Other antepartum hemorrhage, first trimester: Secondary | ICD-10-CM

## 2021-01-02 DIAGNOSIS — O209 Hemorrhage in early pregnancy, unspecified: Secondary | ICD-10-CM

## 2021-01-02 LAB — URINALYSIS, ROUTINE W REFLEX MICROSCOPIC
Bilirubin Urine: NEGATIVE
Glucose, UA: NEGATIVE mg/dL
Ketones, ur: NEGATIVE mg/dL
Leukocytes,Ua: NEGATIVE
Nitrite: NEGATIVE
Protein, ur: 30 mg/dL — AB
RBC / HPF: >50 RBC/hpf — ABNORMAL HIGH (ref 0–5)
Specific Gravity, Urine: 1.023 (ref 1.005–1.030)
pH: 6 (ref 5.0–8.0)

## 2021-01-02 LAB — ABO/RH: ABO/RH(D): O POS

## 2021-01-02 LAB — WET PREP, GENITAL
Clue Cells Wet Prep HPF POC: NONE SEEN
Sperm: NONE SEEN
Trich, Wet Prep: NONE SEEN
Yeast Wet Prep HPF POC: NONE SEEN

## 2021-01-02 LAB — POCT PREGNANCY, URINE: Preg Test, Ur: POSITIVE — AB

## 2021-01-02 LAB — HCG, QUANTITATIVE, PREGNANCY: hCG, Beta Chain, Quant, S: 26851 m[IU]/mL — ABNORMAL HIGH (ref <5)

## 2021-01-02 NOTE — MAU Note (Signed)
Pt reports to mau with c/o vag bleeding for the past 3 days and lower abd pain.  Pt states blood is brown and not as heavy as a period.

## 2021-01-02 NOTE — MAU Provider Note (Signed)
Event Date/Time   First Provider Initiated Contact with Patient 01/02/21 1834      Chief Complaint:  Vaginal Bleeding and Abdominal Pain   Tara Chase is  25 y.o. G1P0000 at [redacted]w[redacted]d presents complaining of Vaginal Bleeding and Abdominal Pain .  Has started care with Dr. Shawnie Pons in [facility], states that there "was a heartbeat" but that's all she knows.  Has had dark red/browish bleeding for several days, but it has increased, although still not heavy enough to wear a pad.  No recent IC.  Obstetrical/Gynecological History: OB History     Gravida  1   Para  0   Term  0   Preterm  0   AB  0   Living  0      SAB  0   IAB  0   Ectopic  0   Multiple  0   Live Births  0          Past Medical History: Past Medical History:  Diagnosis Date   Asthma    Hypertension     Past Surgical History: No past surgical history on file.  Family History: Family History  Problem Relation Age of Onset   Cancer Paternal Grandfather        colon   Hypertension Father    Stroke Father    Hypertension Mother    Cancer Maternal Grandfather        prostate    Social History: Social History   Tobacco Use   Smoking status: Never   Smokeless tobacco: Never  Vaping Use   Vaping Use: Never used  Substance Use Topics   Alcohol use: Not Currently   Drug use: No    Allergies:  Allergies  Allergen Reactions   Pollen Extract Other (See Comments)    Seasonal allergies    Meds:  Medications Prior to Admission  Medication Sig Dispense Refill Last Dose   labetalol (NORMODYNE) 100 MG tablet Take 100 mg by mouth 2 (two) times daily.   01/02/2021   albuterol (PROVENTIL HFA;VENTOLIN HFA) 108 (90 Base) MCG/ACT inhaler Inhale 1-2 puffs into the lungs every 6 (six) hours as needed for wheezing or shortness of breath.      amLODipine (NORVASC) 10 MG tablet Take 10 mg by mouth daily.      hydrochlorothiazide (MICROZIDE) 12.5 MG capsule Take 12.5 mg by mouth daily.      ibuprofen (ADVIL) 800  MG tablet Take 1 tablet (800 mg total) by mouth 3 (three) times daily. 21 tablet 0    losartan (COZAAR) 25 MG tablet Take 25 mg by mouth daily.       Review of Systems   Constitutional: Negative for fever and chills Eyes: Negative for visual disturbances Respiratory: Negative for shortness of breath, dyspnea Cardiovascular: Negative for chest pain or palpitations  Gastrointestinal: Negative for vomiting, diarrhea and constipation Genitourinary: Negative for dysuria and urgency Musculoskeletal: Negative for back pain, joint pain, myalgias.  Normal ROM  Neurological: Negative for dizziness and headaches    Physical Exam  Blood pressure 136/89, pulse 86, temperature 98.7 F (37.1 C), temperature source Oral, resp. rate 15, last menstrual period 11/19/2020, SpO2 99 %. GENERAL: Well-developed, well-nourished female in no acute distress.  LUNGS: Normal respiratory effort HEART: Regular rate and rhythm. ABDOMEN: Soft, nontender, nondistended, gravid.  EXTREMITIES: Nontender, no edema, 2+ distal pulses. DTR's 2+ CERVICAL EXAM: Dilatation 0cm   Effacement 0%, firm, non friable PELVIC:  SSE:  small amount of dark red  blood, appearing to come from cx os   Labs: Results for orders placed or performed during the hospital encounter of 01/02/21 (from the past 24 hour(s))  Pregnancy, urine POC   Collection Time: 01/02/21  5:18 PM  Result Value Ref Range   Preg Test, Ur POSITIVE (A) NEGATIVE  Urinalysis, Routine w reflex microscopic Urine, Clean Catch   Collection Time: 01/02/21  5:55 PM  Result Value Ref Range   Color, Urine YELLOW YELLOW   APPearance HAZY (A) CLEAR   Specific Gravity, Urine 1.023 1.005 - 1.030   pH 6.0 5.0 - 8.0   Glucose, UA NEGATIVE NEGATIVE mg/dL   Hgb urine dipstick LARGE (A) NEGATIVE   Bilirubin Urine NEGATIVE NEGATIVE   Ketones, ur NEGATIVE NEGATIVE mg/dL   Protein, ur 30 (A) NEGATIVE mg/dL   Nitrite NEGATIVE NEGATIVE   Leukocytes,Ua NEGATIVE NEGATIVE   RBC /  HPF >50 (H) 0 - 5 RBC/hpf   WBC, UA 6-10 0 - 5 WBC/hpf   Bacteria, UA RARE (A) NONE SEEN   Squamous Epithelial / LPF 0-5 0 - 5   Mucus PRESENT   Wet prep, genital   Collection Time: 01/02/21  6:39 PM   Specimen: PATH Cytology Cervicovaginal Ancillary Only  Result Value Ref Range   Yeast Wet Prep HPF POC NONE SEEN NONE SEEN   Trich, Wet Prep NONE SEEN NONE SEEN   Clue Cells Wet Prep HPF POC NONE SEEN NONE SEEN   WBC, Wet Prep HPF POC MODERATE (A) NONE SEEN   Sperm NONE SEEN   hCG, quantitative, pregnancy   Collection Time: 01/02/21  6:42 PM  Result Value Ref Range   hCG, Beta Chain, Quant, S 26,851 (H) <5 mIU/mL  ABO/Rh   Collection Time: 01/02/21  6:42 PM  Result Value Ref Range   ABO/RH(D) O POS    No rh immune globuloin      NOT A RH IMMUNE GLOBULIN CANDIDATE, PT RH POSITIVE Performed at [facility], [address] [place], [place] [address]    Imaging Studies:  US OB Comp Less 14 Wks  Result Date: 01/02/2021 CLINICAL DATA:  Vaginal bleeding EXAM: OBSTETRIC <14 WK ULTRASOUND TECHNIQUE: Transabdominal ultrasound was performed for evaluation of the gestation as well as the maternal uterus and adnexal regions. COMPARISON:  None. FINDINGS: Intrauterine gestational sac: Single Yolk sac:  Visualized. Embryo:  Visualized. Cardiac Activity: Visualized. Heart Rate: 107 bpm MSD:    mm    w     d CRL:   2.9 mm   5 w 5 d                  [place] EDC: 08/30/2021 Subchorionic hemorrhage:  Small subchorionic hemorrhage Maternal uterus/adnexae: 3 cm left ovarian simple cyst. No adnexal mass. Trace free fluid in the pelvis. IMPRESSION: Early intrauterine pregnancy, 5 weeks 5 days by crown-rump length. Fetal heart rate 107 beats per minute. Small subchorionic hemorrhage. Electronically Signed   By: Charlett Nose M.D.   On: 01/02/2021 19:48    Assessment: Tara Chase is  25 y.o. G1P0000 at [redacted]w[redacted]d presents with [facility].  Plan: DC home.   Bleeding precautions discussed.   Jacklyn Shell 9/3/20228:19 PM

## 2021-01-05 LAB — GC/CHLAMYDIA PROBE AMP (~~LOC~~) NOT AT ARMC
Chlamydia: NEGATIVE
Comment: NEGATIVE
Comment: NORMAL
Neisseria Gonorrhea: NEGATIVE

## 2021-03-04 ENCOUNTER — Inpatient Hospital Stay (HOSPITAL_COMMUNITY)
Admission: AD | Admit: 2021-03-04 | Discharge: 2021-03-04 | Disposition: A | Discharge status: Home / Self Care | Payer: Medicaid Other | Attending: Obstetrics & Gynecology | Admitting: Obstetrics & Gynecology

## 2021-03-04 ENCOUNTER — Encounter (HOSPITAL_COMMUNITY): Payer: Self-pay | Admitting: Obstetrics & Gynecology

## 2021-03-04 DIAGNOSIS — Z3A15 15 weeks gestation of pregnancy: Secondary | ICD-10-CM

## 2021-03-04 DIAGNOSIS — O99891 Other specified diseases and conditions complicating pregnancy: Secondary | ICD-10-CM | POA: Diagnosis not present

## 2021-03-04 DIAGNOSIS — M542 Cervicalgia: Secondary | ICD-10-CM | POA: Diagnosis not present

## 2021-03-04 MED ORDER — IBUPROFEN 800 MG PO TABS
800.0000 mg | ORAL_TABLET | Freq: Once | ORAL | Status: AC
  Administered 2021-03-04: 800 mg via ORAL
  Filled 2021-03-04: qty 1

## 2021-03-04 MED ORDER — CYCLOBENZAPRINE HCL 10 MG PO TABS: 10.0000 mg | ORAL_TABLET | Freq: Three times a day (TID) | ORAL | 1 refills | Status: DC | PRN

## 2021-03-04 NOTE — MAU Note (Signed)
Pt reports she woke up Tues with pain in the front of her neck  stated it feels like muscle pain having some trouble swallowing ( not SOB).and now pain is radiating into her shoulders. Caled around and no one wil see her because she is pregnant. (They said they can't give her anything). Taking tylenol without relief.

## 2021-03-04 NOTE — MAU Provider Note (Signed)
Patient Tara Chase is a 25 y.o. G1P0000;  At [redacted]w[redacted]d here with complaints of muscle aches in her front neck , sides and tops of her shoulder that started on Tuesday morning at 8 or 9 am (two days ago). She denies vaginal bleeding, vaginal discharge, no contractions. She denies fever, chills, shortness of breath. She denies any tingling or radiculopathy. She reports that the pain started in her front neck and has now radiated towards the top of her shoulder blades. It feels like soreness.  History     CSN: [id number]  Arrival date and time: 03/04/21 1117   Event Date/Time   First Provider Initiated Contact with Patient 03/04/21 1219      Chief Complaint  Patient presents with   Neck Pain   Neck Pain  This is a new problem. The current episode started in the past 7 days. The problem occurs constantly. The pain is present in the anterior neck, right side and left side. The pain is at a severity of 8/10. Pertinent negatives include no chest pain, fever, trouble swallowing or weakness. She has tried acetaminophen (biofreeze) for the symptoms.  She works at a Distribution center, lifting car parts. The business is called [employer]. She works in a job that requires overhead lifting and repetitive motion.  OB History     Gravida  1   Para  0   Term  0   Preterm  0   AB  0   Living  0      SAB  0   IAB  0   Ectopic  0   Multiple  0   Live Births  0           Past Medical History:  Diagnosis Date   Asthma    Hypertension     Past Surgical History:  Procedure Laterality Date   NO PAST SURGERIES      Family History  Problem Relation Age of Onset   Cancer Paternal Grandfather        colon   Hypertension Father    Stroke Father    Hypertension Mother    Cancer Maternal Grandfather        prostate    Social History   Tobacco Use   Smoking status: Never   Smokeless tobacco: Never  Vaping Use   Vaping Use: Never used  Substance Use Topics   Alcohol  use: Not Currently   Drug use: No    Allergies:  Allergies  Allergen Reactions   Pollen Extract Other (See Comments)    Seasonal allergies    Medications Prior to Admission  Medication Sig Dispense Refill Last Dose   albuterol (PROVENTIL HFA;VENTOLIN HFA) 108 (90 Base) MCG/ACT inhaler Inhale 1-2 puffs into the lungs every 6 (six) hours as needed for wheezing or shortness of breath.   Past Week   labetalol (NORMODYNE) 100 MG tablet Take 200 mg by mouth 2 (two) times daily.   03/04/2021    Review of Systems  Constitutional:  Negative for fever.  HENT:  Negative for trouble swallowing.   Cardiovascular:  Negative for chest pain.  Musculoskeletal:  Positive for neck pain.  Neurological:  Negative for weakness.  Physical Exam   Blood pressure (!) 157/95, pulse 94, temperature 98.9 F (37.2 C), resp. rate 18, height 5\' 4"  (1.626 m), weight 92.5 kg, last menstrual period 11/19/2020, SpO2 99 %.  Physical Exam Constitutional:      Appearance: Normal appearance.  HENT:  Head: Normocephalic.     Nose: Nose normal.     Mouth/Throat:     Mouth: Mucous membranes are moist.  Eyes:     Pupils: Pupils are equal, round, and reactive to light.  Neck:     Thyroid: No thyroid mass.     Trachea: Trachea normal. No tracheal tenderness.  Musculoskeletal:     Cervical back: No signs of trauma, rigidity or crepitus. Pain with movement present. Decreased range of motion.  Lymphadenopathy:     Cervical: No cervical adenopathy.  Skin:    General: Skin is warm.  Neurological:     General: No focal deficit present.     Mental Status: She is alert.  Psychiatric:        Mood and Affect: Mood normal.        Behavior: Behavior normal.    MAU Course  Procedures  MDM -FHR by Doppler -patient neck exam was benign, no lumps or masses, no tenderness on exam, no cervical lymphadenopathy. Throat is pale pink, no absesesses, patient has no difficulty opening and closing her mouth.  -patient had  pain scale 8/10 and now 6/10 with ibuprofen -BP was elevated today; patient is known CHTN Assessment and Plan   1. Acute neck pain   -explained that pain is most likely MSK, without evidence of trauma, infection, SOB, difficulty swallowing, radiating nerve pain, her pain is most likely related to her work and repetitive motion.  -continue to try ibuprofen and flexeril at home (Flexeril not given in MAU because she is driving) -recommend that patient return to MAU if pain worsens or she can go to [facility] walk-in urgent clinic. SHe should also return to MAU if fever, unable to swallow or eat, or increased muscle stiffness. Plan of care discussed with MAU MD, who agrees.  -All questions answered; patient stable for discharge.   Charlesetta Garibaldi Jaman Aro 03/04/2021, 12:20 PM

## 2021-03-10 ENCOUNTER — Ambulatory Visit (INDEPENDENT_AMBULATORY_CARE_PROVIDER_SITE_OTHER): Payer: Medicaid Other | Admitting: Family Medicine

## 2021-03-10 ENCOUNTER — Other Ambulatory Visit: Payer: Self-pay

## 2021-03-10 ENCOUNTER — Encounter: Payer: Self-pay | Admitting: Family Medicine

## 2021-03-10 ENCOUNTER — Encounter: Payer: Self-pay | Admitting: General Practice

## 2021-03-10 ENCOUNTER — Other Ambulatory Visit (HOSPITAL_COMMUNITY)
Admission: RE | Admit: 2021-03-10 | Discharge: 2021-03-10 | Disposition: A | Discharge status: Home / Self Care | Payer: Medicaid Other | Source: Ambulatory Visit | Attending: Family Medicine | Admitting: Family Medicine

## 2021-03-10 DIAGNOSIS — O0990 Supervision of high risk pregnancy, unspecified, unspecified trimester: Secondary | ICD-10-CM | POA: Insufficient documentation

## 2021-03-10 DIAGNOSIS — O162 Unspecified maternal hypertension, second trimester: Secondary | ICD-10-CM | POA: Diagnosis not present

## 2021-03-10 DIAGNOSIS — Z3A15 15 weeks gestation of pregnancy: Secondary | ICD-10-CM | POA: Diagnosis not present

## 2021-03-10 DIAGNOSIS — O0992 Supervision of high risk pregnancy, unspecified, second trimester: Secondary | ICD-10-CM | POA: Diagnosis not present

## 2021-03-10 DIAGNOSIS — O169 Unspecified maternal hypertension, unspecified trimester: Secondary | ICD-10-CM

## 2021-03-10 MED ORDER — DOXYLAMINE-PYRIDOXINE 10-10 MG PO TBEC: 2.0000 | DELAYED_RELEASE_TABLET | Freq: Every day | ORAL | 5 refills | Status: DC

## 2021-03-10 NOTE — Progress Notes (Signed)
Subjective:  Tara Chase is a G1P0000 [redacted]w[redacted]d being seen today for her first obstetrical visit. She was previously seen at Dr Tawni Levy office. She is transferring here for continued care.  Her obstetrical history is significant for  CHTN and BMI of 36 . Patient does intend to breast feed. Pregnancy history fully reviewed.  Patient reports no complaints.  BP (!) 144/89   Pulse 96   Wt 210 lb (95.3 kg)   LMP 11/19/2020 (Exact Date)   BMI 36.05 kg/m   HISTORY: OB History  Gravida Para Term Preterm AB Living  1 0 0 0 0 0  SAB IAB Ectopic Multiple Live Births  0 0 0 0 0    # Outcome Date GA Lbr Len/2nd Weight Sex Delivery Anes PTL Lv  1 Current             Past Medical History:  Diagnosis Date   Asthma    Hypertension     Past Surgical History:  Procedure Laterality Date   NO PAST SURGERIES      Family History  Problem Relation Age of Onset   Hypertension Mother    Hypertension Father    Stroke Father    Cancer Maternal Grandfather        prostate   Cancer Paternal Grandfather        colon     Exam  BP (!) 144/89   Pulse 96   Wt 210 lb (95.3 kg)   LMP 11/19/2020 (Exact Date)   BMI 36.05 kg/m   Chaperone present during exam  CONSTITUTIONAL: Well-developed, well-nourished female in no acute distress.  HENT:  Normocephalic, atraumatic, External right and left ear normal. Oropharynx is clear and moist EYES: Conjunctivae and EOM are normal. Pupils are equal, round, and reactive to light. No scleral icterus.  NECK: Normal range of motion, supple, no masses.  Normal thyroid.  CARDIOVASCULAR: Normal heart rate noted, regular rhythm RESPIRATORY: Clear to auscultation bilaterally. Effort and breath sounds normal, no problems with respiration noted. BREASTS: deferred ABDOMEN: Soft, normal bowel sounds, no distention noted.  No tenderness, rebound or guarding.  PELVIC: Deferred MUSCULOSKELETAL: Normal range of motion. No tenderness.  No cyanosis, clubbing, or edema.   2+ distal pulses. SKIN: Skin is warm and dry. No rash noted. Not diaphoretic. No erythema. No pallor. NEUROLOGIC: Alert and oriented to person, place, and time. Normal reflexes, muscle tone coordination. No cranial nerve deficit noted. PSYCHIATRIC: Normal mood and affect. Normal behavior. Normal judgment and thought content.    Assessment:    Pregnancy: G1P0000 Patient Active Problem List   Diagnosis Date Noted   Supervision of high risk pregnancy, antepartum 03/10/2021   Low grade squamous intraepithelial lesion (LGSIL) on cervical Pap smear 02/02/2016      Plan:   1. Supervision of high risk pregnancy, antepartum Initial labs obtained Continue prenatal vitamins Reviewed n/v relief measures and warning s/s to report Reviewed recommended weight gain based on pre-gravid BMI Encouraged well-balanced diet Genetic & carrier screening discussed: requests Panorama, AFP, and Horizon , Ultrasound discussed; fetal survey: requested CCNC completed> form faxed if has or is planning to apply for medicaid The nature of [facility] [employer] with multiple MDs and other Advanced Practice Providers was explained to patient; also emphasized that fellows, residents, and students are part of our team.  - Culture, OB Urine - Genetic Screening - US MFM OB DETAIL +14 WK; Future - AFP, Serum, Open Spina Bifida - CHL AMB BABYSCRIPTS OPT  IN - Protein / creatinine ratio, urine - Comprehensive metabolic panel  2. [redacted] weeks gestation of pregnancy - Culture, OB Urine - Genetic Screening - US MFM OB DETAIL +14 WK; Future - AFP, Serum, Open Spina Bifida - CHL AMB BABYSCRIPTS OPT IN - Protein / creatinine ratio, urine - Comprehensive metabolic panel  3. Hypertension affecting pregnancy, antepartum, unspecified trimester Check CMP, UP:C ASA 81mg  daily Discussed serial , antenatal testing, delivery at 38-39 weeks. - Culture, OB Urine - Genetic Screening - US MFM OB DETAIL +14  WK; Future - AFP, Serum, Open Spina Bifida - CHL AMB BABYSCRIPTS OPT IN - Protein / creatinine ratio, urine - Comprehensive metabolic panel    Problem list reviewed and updated. 75% of 30 min visit spent on counseling and coordination of care.     [place] 03/10/2021

## 2021-03-11 LAB — GC/CHLAMYDIA PROBE AMP (~~LOC~~) NOT AT ARMC
Chlamydia: NEGATIVE
Comment: NEGATIVE
Comment: NORMAL
Neisseria Gonorrhea: NEGATIVE

## 2021-03-11 LAB — PROTEIN / CREATININE RATIO, URINE
Creatinine, Urine: 140.6 mg/dL
Protein, Ur: 12.8 mg/dL
Protein/Creat Ratio: 91 mg/g creat (ref 0–200)

## 2021-03-12 LAB — AFP, SERUM, OPEN SPINA BIFIDA
AFP MoM: 0.84
AFP Value: 24.9 ng/mL
Gest. Age on Collection Date: 15.6 weeks
Maternal Age At EDD: 27.3 yr
OSBR Risk 1 IN: 10000
Test Results:: NEGATIVE
Weight: 210 [lb_av]

## 2021-03-12 LAB — COMPREHENSIVE METABOLIC PANEL
ALT: 28 IU/L (ref 0–32)
AST: 25 IU/L (ref 0–40)
Albumin/Globulin Ratio: 1.3 (ref 1.2–2.2)
Albumin: 3.7 g/dL — ABNORMAL LOW (ref 3.9–5.0)
Alkaline Phosphatase: 80 IU/L (ref 44–121)
BUN/Creatinine Ratio: 7 — ABNORMAL LOW (ref 9–23)
BUN: 4 mg/dL — ABNORMAL LOW (ref 6–20)
Bilirubin Total: <0.2 mg/dL (ref 0.0–1.2)
CO2: 19 mmol/L — ABNORMAL LOW (ref 20–29)
Calcium: 8.8 mg/dL (ref 8.7–10.2)
Chloride: 103 mmol/L (ref 96–106)
Creatinine, Ser: 0.6 mg/dL (ref 0.57–1.00)
Globulin, Total: 2.8 g/dL (ref 1.5–4.5)
Glucose: 122 mg/dL — ABNORMAL HIGH (ref 70–99)
Potassium: 3.7 mmol/L (ref 3.5–5.2)
Sodium: 135 mmol/L (ref 134–144)
Total Protein: 6.5 g/dL (ref 6.0–8.5)
eGFR: 127 mL/min/{1.73_m2} (ref >59)

## 2021-03-16 ENCOUNTER — Encounter: Payer: Self-pay | Admitting: Family Medicine

## 2021-03-16 DIAGNOSIS — R8271 Bacteriuria: Secondary | ICD-10-CM | POA: Insufficient documentation

## 2021-03-16 LAB — URINE CULTURE, OB REFLEX

## 2021-03-16 LAB — CULTURE, OB URINE

## 2021-03-16 MED ORDER — AMOXICILLIN 875 MG PO TABS: 875.0000 mg | ORAL_TABLET | Freq: Two times a day (BID) | ORAL | 0 refills | Status: AC

## 2021-03-16 NOTE — Addendum Note (Signed)
Addended by: Levie Heritage on: 03/16/2021 10:03 AM   Modules accepted: Orders

## 2021-03-18 ENCOUNTER — Encounter: Payer: Self-pay | Admitting: General Practice

## 2021-03-22 NOTE — Progress Notes (Signed)
Patient called and made aware of Panorama results - low risk. Patient also made aware that it is a baby boy. Armandina Stammer RN

## 2021-03-29 ENCOUNTER — Telehealth: Payer: Self-pay

## 2021-03-29 ENCOUNTER — Other Ambulatory Visit: Payer: Self-pay

## 2021-03-29 MED ORDER — TERCONAZOLE 0.4 % VA CREA: 1.0000 | TOPICAL_CREAM | Freq: Every day | VAGINAL | 0 refills | Status: DC

## 2021-03-29 NOTE — Telephone Encounter (Signed)
-----   Message from Marti Sleigh, [place] sent at 03/29/2021  9:26 AM EST ----- Regarding: OB Patient Would like a call back.  Has questions.

## 2021-03-29 NOTE — Progress Notes (Signed)
Patient is [redacted] weeks pregnant and just finished a round of antibiotics for urinary tract infection. Patient stating she is having a lot of itching. Patient states she did try using vinegar to help. Told patient to use teraconozole for three nights vaginally for yeast. If it is not resolved she will need to come in for evaluation. Armandina Stammer RN

## 2021-03-29 NOTE — Telephone Encounter (Signed)
Returned call to patient.  Left message for patient to return call to office. Armandina Stammer RN

## 2021-03-30 ENCOUNTER — Telehealth: Payer: Self-pay

## 2021-03-30 NOTE — Telephone Encounter (Signed)
Dental office calling to get a pregnancy dental letter for patient.

## 2021-04-01 NOTE — Progress Notes (Signed)
   PRENATAL VISIT NOTE  Subjective:  Tara Chase is a 25 y.o. G1P0000 at [redacted]w[redacted]d being seen today for ongoing prenatal care.  She is currently monitored for the following issues for this low-risk pregnancy and has Low grade squamous intraepithelial lesion (LGSIL) on cervical Pap smear; Supervision of high risk pregnancy, antepartum; and GBS bacteriuria on their problem list.  Patient reports no complaints.  Contractions: Not present. Vag. Bleeding: None.  Movement: Present. Denies leaking of fluid.   The following portions of the patient's history were reviewed and updated as appropriate: allergies, current medications, past family history, past medical history, past social history, past surgical history and problem list.   Objective:   Vitals:   04/07/21 0823 04/07/21 0830  BP: (!) 155/94 140/84  Pulse: 93 97  Weight: 212 lb (96.2 kg)     Fetal Status: Fetal Heart Rate (bpm): 144   Movement: Present     General:  Alert, oriented and cooperative. Patient is in no acute distress.  Skin: Skin is warm and dry. No rash noted.   Cardiovascular: Normal heart rate noted  Respiratory: Normal respiratory effort, no problems with respiration noted  Abdomen: Soft, gravid, appropriate for gestational age.  Pain/Pressure: Absent     Pelvic: Cervical exam deferred        Extremities: Normal range of motion.  Edema: Trace  Mental Status: Normal mood and affect. Normal behavior. Normal judgment and thought content.   Assessment and Plan:  Pregnancy: G1P0000 at [redacted]w[redacted]d 1. GBS bacteriuria - PCN in labor  2. Supervision of high risk pregnancy, antepartum - Offered and recommended MSAFP - Anatomy US scheduled for 12/5 - Reviewed external medications - was on Lisinopril and Labetalol at some point. Clarified history: She does have CHTN and is now on Labetalol only. She last took it about 30 minutes ago. Recheck was improved at 140/84. Chart updated. Discussed timing of delivery later in pregnancy.  Prior to pregnancy she was on Lisinopril and has been on it since high school. She has wrist cuff, she will bring to next appointment. She has babyscripts app. Discussed use of the app. - Discussed work restrictions and letter given. Discussed if she needs to work 8 hours instead of 10 hrs, we can do this letter or if she needs more breaks/etc we can accomodate. She works in the [employer] center for [employer].   3. Low grade squamous intraepithelial lesion (LGSIL) on cervical Pap smear - 2017 abnormal pap. Ideally would have two normals. Last pap was 2021. Will recheck postpartum  Preterm labor symptoms and general obstetric precautions including but not limited to vaginal bleeding, contractions, leaking of fluid and fetal movement were reviewed in detail with the patient. Please refer to After Visit Summary for other counseling recommendations.   Return in about 4 weeks (around 05/05/2021) for OB VISIT, MD only.  Future Appointments  Date Time Provider Department Center  04/07/2021  8:35 AM Milas Hock, MD [facility] None  05/06/2021  8:30 AM [facility] NURSE [facility]  05/06/2021  8:45 AM [facility] US6 [facility]    Milas Hock, MD

## 2021-04-05 ENCOUNTER — Encounter: Payer: Self-pay | Admitting: *Deleted

## 2021-04-05 ENCOUNTER — Ambulatory Visit: Payer: Medicaid Other | Attending: Family Medicine

## 2021-04-05 ENCOUNTER — Other Ambulatory Visit: Payer: Self-pay | Admitting: *Deleted

## 2021-04-05 ENCOUNTER — Other Ambulatory Visit: Payer: Self-pay

## 2021-04-05 ENCOUNTER — Ambulatory Visit: Payer: Medicaid Other | Attending: Family Medicine | Admitting: Maternal & Fetal Medicine

## 2021-04-05 ENCOUNTER — Ambulatory Visit: Payer: Medicaid Other | Admitting: *Deleted

## 2021-04-05 VITALS — BP 137/82 | HR 97

## 2021-04-05 DIAGNOSIS — O0990 Supervision of high risk pregnancy, unspecified, unspecified trimester: Secondary | ICD-10-CM

## 2021-04-05 DIAGNOSIS — Z3A19 19 weeks gestation of pregnancy: Secondary | ICD-10-CM | POA: Diagnosis not present

## 2021-04-05 DIAGNOSIS — O10012 Pre-existing essential hypertension complicating pregnancy, second trimester: Secondary | ICD-10-CM

## 2021-04-05 DIAGNOSIS — O169 Unspecified maternal hypertension, unspecified trimester: Secondary | ICD-10-CM | POA: Diagnosis present

## 2021-04-05 DIAGNOSIS — O10912 Unspecified pre-existing hypertension complicating pregnancy, second trimester: Secondary | ICD-10-CM

## 2021-04-05 DIAGNOSIS — Z3A15 15 weeks gestation of pregnancy: Secondary | ICD-10-CM | POA: Insufficient documentation

## 2021-04-05 DIAGNOSIS — Z6833 Body mass index (BMI) 33.0-33.9, adult: Secondary | ICD-10-CM

## 2021-04-05 NOTE — Progress Notes (Addendum)
MFM Brief Note  Tara Chase is a 25 yo G1P0 at 19w 4d here in consultation regarding a detailed ultrasound for chronic hypertension at the request of Dr. Candelaria Celeste.  She is dated by LMP with an EDD of 08/26/21 consistent with a 5 wk ultrasound.  Single intrauterine pregnancy was observed.  Normal anatomy with measurements consistent with dates There is good fetal movement and amniotic fluid volume Suboptimal views of the fetal anatomy were obtained secondary to fetal position.  She is taking Labetalol 100 mg BID. Her blood pressure is 137/82 mmHg  We discussed the increased risk for preeclampsia, placental abruption and fetal growth restriction in women with chronic hypertension. She is taking low dose ASA for preeclampsia prevention. I explained that serial growth exams should be performed every 4 weeks throughout the pregnancy. Medical therapy should be initiated if blood pressure persist >150/100's. If medical therapy is initiated we recommend weekly testing at 32 weeks. She is aware of the s/sx or preeclampsia including headache, vision changes and right upper quadrant pain.  Baseline labs of UPC, CBC, and CMP were performed and are normal.  All questions answered.  I scheduled [name] to return in 4-5 weeks to complete the fetal anatomy.  Novella Olive, MD  I spent 30 minutes with > 50% in face to face consultation.

## 2021-04-07 ENCOUNTER — Ambulatory Visit (INDEPENDENT_AMBULATORY_CARE_PROVIDER_SITE_OTHER): Payer: Medicaid Other | Admitting: Obstetrics and Gynecology

## 2021-04-07 ENCOUNTER — Encounter: Payer: Self-pay | Admitting: Obstetrics and Gynecology

## 2021-04-07 ENCOUNTER — Other Ambulatory Visit: Payer: Self-pay

## 2021-04-07 VITALS — BP 140/84 | HR 97 | Wt 212.0 lb

## 2021-04-07 DIAGNOSIS — Z3A19 19 weeks gestation of pregnancy: Secondary | ICD-10-CM

## 2021-04-07 DIAGNOSIS — R87612 Low grade squamous intraepithelial lesion on cytologic smear of cervix (LGSIL): Secondary | ICD-10-CM

## 2021-04-07 DIAGNOSIS — R8271 Bacteriuria: Secondary | ICD-10-CM

## 2021-04-07 DIAGNOSIS — O10919 Unspecified pre-existing hypertension complicating pregnancy, unspecified trimester: Secondary | ICD-10-CM | POA: Insufficient documentation

## 2021-04-07 DIAGNOSIS — O0990 Supervision of high risk pregnancy, unspecified, unspecified trimester: Secondary | ICD-10-CM

## 2021-04-08 ENCOUNTER — Other Ambulatory Visit: Payer: Self-pay

## 2021-04-08 ENCOUNTER — Telehealth: Payer: Self-pay

## 2021-04-08 ENCOUNTER — Encounter: Payer: Self-pay | Admitting: Obstetrics and Gynecology

## 2021-04-08 DIAGNOSIS — O24419 Gestational diabetes mellitus in pregnancy, unspecified control: Secondary | ICD-10-CM | POA: Insufficient documentation

## 2021-04-08 LAB — GLUCOSE TOLERANCE, 2 HOURS W/ 1HR
Glucose, 1 hour: 178 mg/dL (ref 70–179)
Glucose, 2 hour: 122 mg/dL (ref 70–152)
Glucose, Fasting: 92 mg/dL — ABNORMAL HIGH (ref 70–91)

## 2021-04-08 MED ORDER — ACCU-CHEK GUIDE W/DEVICE KIT: PACK | 0 refills | Status: DC

## 2021-04-08 MED ORDER — ACCU-CHEK SOFTCLIX LANCETS MISC: 12 refills | Status: DC

## 2021-04-08 MED ORDER — GLUCOSE BLOOD VI STRP: ORAL_STRIP | 12 refills | Status: DC

## 2021-04-08 NOTE — Telephone Encounter (Signed)
Patient returned call to clinic. Patient made aware that she has GDM. Patient made aware that she will be scheduled with gestational diabetes educator. Will send in supplies and order for nutrition and diabetes class is in the patients referrals. Armandina Stammer RN

## 2021-04-08 NOTE — Telephone Encounter (Signed)
-----   Message from Milas Hock, MD sent at 04/08/2021 11:00 AM EST ----- She has GDM (barely) - I would recommend f/u with diabetes educator etc and have her start testing. I will update her chart. Thanks! pad

## 2021-04-08 NOTE — Telephone Encounter (Signed)
Called pt to inform her of elevated 2 hr GTT. Left message for pt to call the office back. Testing supplies will be ordered and a referral will be sent to Diabetes Education after pt is made aware of her results.  Garvin Ellena l Merleen Picazo, CMA

## 2021-04-28 ENCOUNTER — Encounter: Payer: Self-pay | Admitting: Registered"

## 2021-04-28 ENCOUNTER — Encounter: Payer: Medicaid Other | Attending: Obstetrics and Gynecology | Admitting: Registered"

## 2021-04-28 ENCOUNTER — Other Ambulatory Visit: Payer: Self-pay

## 2021-04-28 DIAGNOSIS — O24419 Gestational diabetes mellitus in pregnancy, unspecified control: Secondary | ICD-10-CM | POA: Diagnosis not present

## 2021-04-28 NOTE — Progress Notes (Signed)
Patient was seen on 04/28/21 for Gestational Diabetes self-management class [facility]. The following learning objectives were met by the patient during this course:  States the definition of Gestational Diabetes States why dietary management is important in controlling blood glucose Describes the effects each nutrient has on blood glucose levels Demonstrates ability to create a balanced meal plan Demonstrates carbohydrate counting  States when to check blood glucose levels Demonstrates proper blood glucose monitoring techniques States the effect of stress and exercise on blood glucose levels States the importance of limiting caffeine and abstaining from alcohol and smoking  Blood glucose monitor given: Accu-chek Guide Me Lot #[id number] Exp: 06/11/2022 CBG: 85 mg/dL  Patient instructed to monitor glucose levels: FBS: 60 - <95; 1 hour: <140; 2 hour: <120  Patient received handouts: Nutrition Diabetes and Pregnancy, including carb counting list  Patient will be seen for follow-up as needed.

## 2021-05-06 ENCOUNTER — Other Ambulatory Visit: Payer: Self-pay

## 2021-05-06 ENCOUNTER — Ambulatory Visit: Payer: Medicaid Other | Admitting: *Deleted

## 2021-05-06 ENCOUNTER — Ambulatory Visit: Payer: Medicaid Other | Attending: Maternal & Fetal Medicine

## 2021-05-06 ENCOUNTER — Other Ambulatory Visit: Payer: Self-pay | Admitting: *Deleted

## 2021-05-06 VITALS — BP 136/88 | HR 105

## 2021-05-06 DIAGNOSIS — Z6833 Body mass index (BMI) 33.0-33.9, adult: Secondary | ICD-10-CM

## 2021-05-06 DIAGNOSIS — O99212 Obesity complicating pregnancy, second trimester: Secondary | ICD-10-CM | POA: Insufficient documentation

## 2021-05-06 DIAGNOSIS — O24419 Gestational diabetes mellitus in pregnancy, unspecified control: Secondary | ICD-10-CM

## 2021-05-06 DIAGNOSIS — Z3A24 24 weeks gestation of pregnancy: Secondary | ICD-10-CM

## 2021-05-06 DIAGNOSIS — O10912 Unspecified pre-existing hypertension complicating pregnancy, second trimester: Secondary | ICD-10-CM | POA: Diagnosis not present

## 2021-05-06 DIAGNOSIS — O0990 Supervision of high risk pregnancy, unspecified, unspecified trimester: Secondary | ICD-10-CM

## 2021-05-06 DIAGNOSIS — E669 Obesity, unspecified: Secondary | ICD-10-CM | POA: Diagnosis not present

## 2021-05-06 DIAGNOSIS — O24410 Gestational diabetes mellitus in pregnancy, diet controlled: Secondary | ICD-10-CM | POA: Insufficient documentation

## 2021-05-06 DIAGNOSIS — O10012 Pre-existing essential hypertension complicating pregnancy, second trimester: Secondary | ICD-10-CM

## 2021-05-06 DIAGNOSIS — Z363 Encounter for antenatal screening for malformations: Secondary | ICD-10-CM | POA: Diagnosis not present

## 2021-05-06 DIAGNOSIS — O10919 Unspecified pre-existing hypertension complicating pregnancy, unspecified trimester: Secondary | ICD-10-CM

## 2021-05-07 ENCOUNTER — Ambulatory Visit (INDEPENDENT_AMBULATORY_CARE_PROVIDER_SITE_OTHER): Payer: Medicaid Other | Admitting: Family Medicine

## 2021-05-07 ENCOUNTER — Other Ambulatory Visit: Payer: Self-pay

## 2021-05-07 VITALS — BP 130/79 | HR 101 | Wt 214.0 lb

## 2021-05-07 DIAGNOSIS — O10912 Unspecified pre-existing hypertension complicating pregnancy, second trimester: Secondary | ICD-10-CM

## 2021-05-07 DIAGNOSIS — R87612 Low grade squamous intraepithelial lesion on cytologic smear of cervix (LGSIL): Secondary | ICD-10-CM

## 2021-05-07 DIAGNOSIS — F4321 Adjustment disorder with depressed mood: Secondary | ICD-10-CM

## 2021-05-07 DIAGNOSIS — O0990 Supervision of high risk pregnancy, unspecified, unspecified trimester: Secondary | ICD-10-CM

## 2021-05-07 DIAGNOSIS — Z3A24 24 weeks gestation of pregnancy: Secondary | ICD-10-CM | POA: Diagnosis not present

## 2021-05-07 DIAGNOSIS — R8271 Bacteriuria: Secondary | ICD-10-CM

## 2021-05-07 DIAGNOSIS — O0992 Supervision of high risk pregnancy, unspecified, second trimester: Secondary | ICD-10-CM

## 2021-05-07 DIAGNOSIS — O10919 Unspecified pre-existing hypertension complicating pregnancy, unspecified trimester: Secondary | ICD-10-CM

## 2021-05-07 DIAGNOSIS — O24410 Gestational diabetes mellitus in pregnancy, diet controlled: Secondary | ICD-10-CM

## 2021-05-07 DIAGNOSIS — O24419 Gestational diabetes mellitus in pregnancy, unspecified control: Secondary | ICD-10-CM

## 2021-05-07 LAB — GLUCOSE, POCT (MANUAL RESULT ENTRY): POC Glucose: 117 mg/dl — AB (ref 70–99)
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
## 2021-05-12 ENCOUNTER — Ambulatory Visit (INDEPENDENT_AMBULATORY_CARE_PROVIDER_SITE_OTHER): Payer: Medicaid Other | Admitting: Licensed Clinical Social Worker

## 2021-05-12 DIAGNOSIS — F4321 Adjustment disorder with depressed mood: Secondary | ICD-10-CM

## 2021-05-13 NOTE — BH Specialist Note (Signed)
Integrated Behavioral Health via Telemedicine Visit  05/13/2021 Tara Chase [id number]  Number of Integrated Behavioral Health visits: 1 Session Start time: 815am  Session End time: 840am Total time: 25 mins via mychart video   Referring Provider: Dr. Adrian Blackwater  Patient/Family location: Home  [facility] Provider location: [facility]  All persons participating in visit: Pt C Parkison and LCSW A. Cleveland Paiz  Types of Service: Individual psychotherapy and Video visit  I connected with Sayuri J Piccini and/or Willadene J Guilford's n/a via  Telephone or Video Enabled Telemedicine Application  (Video is Caregility application) and verified that I am speaking with the correct person using two identifiers. Discussed confidentiality: Yes   I discussed the limitations of telemedicine and the availability of in person appointments.  Discussed there is a possibility of technology failure and discussed alternative modes of communication if that failure occurs.  I discussed that engaging in this telemedicine visit, they consent to the provision of behavioral healthcare and the services will be billed under their insurance.  Patient and/or legal guardian expressed understanding and consented to Telemedicine visit: Yes   Presenting Concerns: Patient and/or family reports the following symptoms/concerns: grieving loss of family member and adjustment disorder with depressed mood  Duration of problem: approx 4 weeks ; Severity of problem: mild  Patient and/or Family's Strengths/Protective Factors: Concrete supports in place (healthy food, safe environments, etc.)  Goals Addressed: Patient will:  Reduce symptoms of: depression and stress   Increase knowledge and/or ability of: coping skills and stress reduction   Demonstrate ability to: Increase healthy adjustment to current life circumstances, Increase adequate support systems for patient/family, and Begin healthy grieving over loss  Progress towards  Goals: Ongoing  Interventions: Interventions utilized:  Mindfulness or Relaxation Training and Supportive Counseling Standardized Assessments completed: PHQ 9  Patient and/or Family Response: Pt responded well to appt   Assessment: Patient currently experiencing adjustment disorder with depressed mood and grieve loss of family member.   Patient may benefit from integrated behavioral health.  Plan: Follow up with behavioral health clinician on : 3 weeks via mychart  Behavioral recommendations: Start a scrapbook to [employer] family member, prioritize rest, start journal writing to identify emotional triggers and grieving process, keep medical appts, and  Referral(s): Integrated [employer] (In Clinic)  I discussed the assessment and treatment plan with the patient and/or parent/guardian. They were provided an opportunity to ask questions and all were answered. They agreed with the plan and demonstrated an understanding of the instructions.   They were advised to call back or seek an in-person evaluation if the symptoms worsen or if the condition fails to improve as anticipated.  Gwyndolyn Saxon, LCSW

## 2021-05-21 ENCOUNTER — Ambulatory Visit (INDEPENDENT_AMBULATORY_CARE_PROVIDER_SITE_OTHER): Payer: Medicaid Other | Admitting: Family Medicine

## 2021-05-21 ENCOUNTER — Other Ambulatory Visit: Payer: Self-pay

## 2021-05-21 VITALS — BP 144/86 | HR 92 | Wt 214.0 lb

## 2021-05-21 DIAGNOSIS — R87612 Low grade squamous intraepithelial lesion on cytologic smear of cervix (LGSIL): Secondary | ICD-10-CM

## 2021-05-21 DIAGNOSIS — Z3A26 26 weeks gestation of pregnancy: Secondary | ICD-10-CM

## 2021-05-21 DIAGNOSIS — Z23 Encounter for immunization: Secondary | ICD-10-CM

## 2021-05-21 DIAGNOSIS — R8271 Bacteriuria: Secondary | ICD-10-CM

## 2021-05-21 DIAGNOSIS — O10919 Unspecified pre-existing hypertension complicating pregnancy, unspecified trimester: Secondary | ICD-10-CM

## 2021-05-21 DIAGNOSIS — O0990 Supervision of high risk pregnancy, unspecified, unspecified trimester: Secondary | ICD-10-CM

## 2021-05-21 DIAGNOSIS — O24419 Gestational diabetes mellitus in pregnancy, unspecified control: Secondary | ICD-10-CM

## 2021-05-21 NOTE — Progress Notes (Signed)
° °  PRENATAL VISIT NOTE  Subjective:  Tara Chase is a 26 y.o. G1P0000 at [redacted]w[redacted]d being seen today for ongoing prenatal care.  She is currently monitored for the following issues for this high-risk pregnancy and has Low grade squamous intraepithelial lesion (LGSIL) on cervical Pap smear; Supervision of high risk pregnancy, antepartum; GBS bacteriuria; Chronic hypertension affecting pregnancy; and Gestational diabetes on their problem list.  Patient reports no complaints.  Contractions: Not present. Vag. Bleeding: None.  Movement: Present. Denies leaking of fluid.   The following portions of the patient's history were reviewed and updated as appropriate: allergies, current medications, past family history, past medical history, past social history, past surgical history and problem list.   Objective:   Vitals:   05/21/21 0829  BP: (!) 144/86  Pulse: 92  Weight: 214 lb (97.1 kg)    Fetal Status: Fetal Heart Rate (bpm): 145   Movement: Present     General:  Alert, oriented and cooperative. Patient is in no acute distress.  Skin: Skin is warm and dry. No rash noted.   Cardiovascular: Normal heart rate noted  Respiratory: Normal respiratory effort, no problems with respiration noted  Abdomen: Soft, gravid, appropriate for gestational age.  Pain/Pressure: Absent     Pelvic: Cervical exam deferred        Extremities: Normal range of motion.  Edema: Trace  Mental Status: Normal mood and affect. Normal behavior. Normal judgment and thought content.   Assessment and Plan:  Pregnancy: G1P0000 at [redacted]w[redacted]d 1. Supervision of high risk pregnancy, antepartum FHT and FH normal - CBC - RPR - HIV antibody (with reflex) - Hepatitis B Surface AntiGEN - Hepatitis C Antibody  2. [redacted] weeks gestation of pregnancy - CBC - RPR - HIV antibody (with reflex) - Hepatitis B Surface AntiGEN - Hepatitis C Antibody  3. Low grade squamous intraepithelial lesion (LGSIL) on cervical Pap smear PAP  postpartum  4. Gestational diabetes mellitus (GDM), antepartum, gestational diabetes method of control unspecified MMostly controlled  5. Chronic hypertension affecting pregnancy BP slightly increased.  Increase labetalol to 400mg  BID Serial growth [place] - EFW 18%  6. GBS bacteriuria Intrapartum ppx  Preterm labor symptoms and general obstetric precautions including but not limited to vaginal bleeding, contractions, leaking of fluid and fetal movement were reviewed in detail with the patient. Please refer to After Visit Summary for other counseling recommendations.   No follow-ups on file.  Future Appointments  Date Time Provider [place]  06/04/2021  9:35 AM Truett Mainland, DO [facility] None  06/07/2021  9:15 AM [facility] NURSE [facility]  06/07/2021  9:30 AM [facility] US3 [facility]  06/18/2021 10:35 AM Truett Mainland, DO [facility] None  07/02/2021  9:35 AM Truett Mainland, DO [facility] None    Truett Mainland, DO

## 2021-05-21 NOTE — Patient Instructions (Signed)
AREA PEDIATRIC/FAMILY PRACTICE PHYSICIANS  [place] ([address]) [facility] Chambliss, MD; Eniola, MD; Hale, MD; Hensel, MD; McDiarmid, MD; McIntyer, MD; Neal, MD; Walden, MD [address] [place], [place] [address] [phone] Mon-Fri 8:30-12:30, 1:30-5:00 Providers come to see babies at [facility] Accepting Medicaid [facility] providers who accept newborns: Koirala, MD; Morrow, MD; Wolters, MD [address], [place], [place] [address] [phone] Mon-Fri 8:00-5:30 Babies seen by providers at [facility] Does NOT accept Medicaid Please call early in hospitalization for appointment (limited availability)  [facility] Mulberry, MD [address] [place], [place] [address] [phone] Mon, Tue, Thur, Fri 8:30-5:00, Wed 10:00-7:00 (closed 1-2pm) Babies seen by [facility] providers Accepting Medicaid Rubin - Pediatrician Rubin, MD [address], [place], [place] [address] [phone] Mon-Fri 8:30-5:00, Sat 8:30-12:00 Provider comes to see babies at [facility] Accepting Medicaid Must have been referred from current patients or contacted office prior to delivery [facility] ([facility]) Brown, MD; Chandler, MD; Ettefagh, MD; Grant, MD; Lester, MD; McCormick, MD; McQueen, MD; Prose, MD; Simha, MD; Stanley, MD; Stryffeler, NP; Tebben, NP [address], [place], [place] [address] [phone] Mon, Tue, Thur, Fri 8:30-5:30, Wed 9:30-5:30, Sat 8:30-12:30 Babies seen by [facility] providers Accepting Medicaid Only accepting infants of first-time parents or siblings of current patients Hospital discharge coordinator will make follow-up appointment Jack Amos [address], [place], [place]  [address] [phone]   Fax - [phone] [facility] [address], [place], [place]  [address] Phone - [phone]   Fax - [phone] Shilpa Gosrani [address], [place], [place]  [address] [phone]  [place] ([address]) [facility] Bates, MD; Brassfield, MD; Cooper, Cox, MD; MD; Davis, MD; Dovico, MD; Ettefaugh, MD; Little, MD; Lowe, MD; Keiffer, MD; Melvin, MD; Sumner, MD; Williams, MD [address], [place], [place] [address] [phone] Mon-Fri 8:30-5:00 (extended evenings Mon-Thur as needed), Sat-Sun 10:00-1:00 Providers come to see babies at [facility] Accepting Medicaid for families of first-time babies and families with all children in the household age 3 and under. Must register with office prior to making appointment (M-F only). [facility] Henson, NP; Knapp, MD; Lalonde, MD; Tysinger, PA [address] [place], [place] [address] [phone] Mon-Fri 8:00-5:00 Babies seen by providers at [facility] Does NOT accept Medicaid/Commercial Insurance Only [facility] ([facility])  Artis, MD; Barnes, MD; Bratton, MD; Coccaro, MD; Lockett Gardner, MD; Kramer, MD; Marshall, MD; Netherton, MD; Poleto, MD; Skinner, MD [address] [place], [place] [address] [phone] Mon-Fri 8:30-5:30, Sat (Oct.-Mar.) 9:00-1:00 Babies seen by providers at [facility] Accepting Medicaid  [place] ([address]) [facility] Reid, MD; Warner, MD [address], [place], [place] [address] [phone] Mon-Fri 8:30-5:00, Sat 8:30-12:00 Providers come to see babies at [facility] Does NOT accept Medicaid [facility] [place], PA; Hagler, MD; Scifres, PA; Sun, MD; Swayne, MD [address], [place], [place] [address] [phone] Mon-Fri 8:00-5:00 Babies seen by providers at [facility] Does NOT accept Medicaid Only accepting babies of parents who  are patients Please call early in hospitalization for appointment (limited availability)  Pediatricians Clark, MD; Frye, MD; Kelleher, MD; Mack, NP; Miller, MD; O'Keller, MD; Patterson, NP; Pudlo, MD; Puzio, MD; Thomas, MD; Tucker, MD; Twiselton, MD [address], [place], [place] [address] [phone] Mon-Fri 8:00-5:00, Sat 9:00-12:00 Providers come to see babies at [facility] Does NOT accept Medicaid  [place] ([address]) [facility] providers accepting new patients: Brake, NP; Wharton, PA [address], [place], [place] [address] [phone] Mon-Fri 8:00-5:00 Babies seen by providers at [facility] Does NOT accept Medicaid Only accepting babies of parents who are patients Please call early in hospitalization for appointment (limited availability) [facility] Gay, MD; Quinlan, MD [address] [place], [place] [address] [phone] (press 1 to schedule appointment) Mon-Fri 8:00-5:00 Providers come to see babies at [facility] Does NOT accept Medicaid [facility] Mazer, MD [address] [place], [place] [address] [phone] Mon-Fri 8:30-5:00 (lunch 12:30-1:00), extended hours by appointment only Wed 5:00-6:30 Babies seen by [facility] providers Accepting Medicaid [facility] Banks, MD; Jordan, MD; Koberlein, MD [address], [place], [place] [address] [phone] Mon-Fri 8:00-5:00 Babies seen by [facility] providers Does NOT accept Medicaid [facility] Parker, MD; Alford, MD; Wallace, DO [address] [place], [place] [address] [phone] Mon-Fri 8:00-5:00 Babies seen by [facility] providers Does NOT accept Medicaid [facility] Brandon, PA; Brecken, PA; Christy, NP; Dees, MD; DeClaire, MD; DeWeese, MD; Hansen, NP; Mills, NP; [place], NP; Smoot, NP; Summer, MD; Vapne,  MD [address] [place], [place] [address] [phone] Mon-Fri 8:30-5:00, Sat 10:00-1:00 Providers come to see babies at [facility] Does NOT accept Medicaid Free prenatal information session Tuesdays at 4:45pm [facility] Bouska, MD; Gordon, PA; Jeffery, PA; Weber, PA [address] [place] [address] [phone] Mon-Fri 7:30-5:30 Babies seen by [facility] providers [facility] [address], [place], [place]  [address] [phone]   Fax - [phone]  [place] ([address] & [address]) [facility] [place] [address] [place], [place] [address] [phone] Mon-Thur 8:00-6:00 Providers come to see babies at [facility] Accepting Medicaid [facility] Anderson, NP; Badger, MD; Beal, PA; Spencer, PA [address] [place], [place] [address] [phone] Mon-Thur 7:30-7:30, Fri 7:30-4:30 Babies seen by [facility] providers Accepting Medicaid [facility] Agbuya, MD; Klett, NP; Romgoolam, MD [address], [place], [place] [address] [phone] Mon-Fri 8:30-5:00, Sat 8:30-12:00 Providers come to see babies at [facility] Accepting Medicaid Must have "Meet & Greet" appointment at office prior to delivery [facility] ([facility] McCord, MD; Wallace, MD; Wood, MD [address], [place], [place] [address] [phone] Mon-Wed 8:00-6:00, Thur-Fri 8:00-5:00, Sat 9:00-12:00 Providers come to see babies at [facility] Does NOT accept Medicaid Only accepting siblings of current patients [facility]  [address], [place], [place]  [address] [phone]   Fax - [phone] [facility] [address], [place], [place]  [address] [phone]   Fax -  [phone]  [place] ([address] & [address]) [facility] Cirigliano, DO; Matthews, DO [address] [place], [place] [address] [phone] Mon-Fri 7:00-5:00 Babies seen by [facility] providers Does NOT accept Medicaid [facility] Briscoe, MD; Howley, PA; Moreira, PA [address], [place], [place] [address] [phone] Mon-Fri 8:00-5:00 Babies seen by [facility] providers Accepting Medicaid [facility] Boyd, MD; Church, PA; Jones, NP; Osborn, PA [address], [place], [place] [address] [phone] Mon-Fri 8:00-5:00 Babies seen by providers at [facility] Accepting Medicaid  [place] ([address]) [facility] [place] Wendling, DO [address] [place], [place] [address] [phone] Mon-Fri 8:00-5:00 Babies seen by [facility] providers Does NOT accept Medicaid Limited availability, please call early in hospitalization to schedule follow-up [facility] Calderon, PA; Cummings, MD; Dillard, MD; Martin, PA; Olson, MD; VanDeven, PA [address], [place], [place] [address] [phone] Mon-Fri 8:30-5:00, Sat 9:00-12:00 Babies seen by providers at [facility] Accepting Medicaid Please register online then schedule online or call office [email or web address] Janish, NP; Kumar, MD; Martin Rogers, PA [address], [place], [place] [address] [phone] Mon-Fri 8:00-5:00 Babies seen by providers at [facility] Accepting Medicaid [facility] ([facility]) [place], MD; Kristi Fleenor, NP; West, MD [address], [place], [place] [address] [phone] Mon-Fri 8:00-5:30, Sat&Sun by appointment (phones open at  8:30) Babies seen by [facility] providers Accepting Medicaid Must be a first-time baby or sibling of current patient [facility] [place]  [address], [place], [place]  [address] [phone]   Fax - [phone]  [place] ([address]) [facility] Brown, PA; Cowen, PA; Rice, MD; Helton, PA; Spry, MD [address] [place], [place] [address] [phone] Mon-Thur 8:00-7:00, Fri 8:00-5:00, Sat 8:00-12:00, Sun 9:00-12:00 Babies seen by [facility] providers Accepting Medicaid [facility] Coe-Goins, MD; Marshall, MD; Pierre-Louis, MD [address] [place], [place], [place] [address] [phone] Mon-Thur 8:00-5:00 Babies seen by providers at [facility] Accepting Medicaid [facility] Bratton, MD; Coe-Goins, MD; Hayes, MD; Lewis, MD; List, MD; Lott, MD; Marshall, MD; Moran, MD; O'Neal, MD; Pierre-Louis, MD; Pitonzo, MD; Scholer, MD; Spangle, MD [address] [place], [place] [address] [phone] Mon-Fri 8:00-5:30, Sat (Oct.-Mar.) 9:00-1:00 Babies seen by providers at [facility] Accepting Medicaid Must fill out new patient packet, available online at [email or web address] [facility] ([facility]) Friddle, NP; Harris, NP; Kelly, NP; Logan, MD; Melvin, PA; Poth, MD; Ramadoss, MD; Stanton, NP [address], [place], [place] [address] [phone] Mon-Thur 8:00-5:30, Fri 8:00-5:00 Babies seen by providers at [facility] Accepting Medicaid  [place] ([address]) [facility] Dixon, PA; Anton Chico, MD; Pickard, MD; Tapia, PA [address], [place], [place] [address] [phone] Mon-Fri 8:00-5:00 Babies seen by providers at [facility] Accepting Medicaid   [place] ([address]) [facility] [place] Masneri, DO; Meyers, MD; Nelson, PA [address], [place], [place] [address] [phone] Mon-Fri 8:00-5:00 Babies seen by providers at [facility] Does NOT accept Medicaid Limited appointment availability, please call early in hospitalization   [facility] [place] Kunedd, DO; McGowen, MD [address], [place],  [address] [phone] Mon-Fri 8:00-5:00 Babies seen by [facility] providers Does NOT accept Medicaid [facility] [place] Cameron, MD; MacDonald, MD; Michaels, PA; Nayak, MD [address] [place] [address], [place],  [address] [phone] Mon-Fri 8:00-5:00 After hours clinic ([address] ,  [address]) [phone] Mon-Fri 5:00-8:00, Sat 12:00-6:00, Sun 10:00-4:00 Babies seen by [facility] providers Accepting Medicaid [facility] [place] [address], [place], [place]  [address] [phone]   Fax - [phone]  [place] ([address]) [facility] [place] [facility] Andy, MD [address], [place], [place] [address] [phone] Mon-Fri 8:00-5:00 Babies seen by [facility] providers Does NOT accept Medicaid [facility] [place] ([facility] [place]) Eksir, MD [address], [place], [place] [address] [phone] Mon-Thur 8:00-7:00, Fri 8:00-5:00, Sat 8:00-12:00 Babies seen by providers at [facility] Accepting Medicaid - but does not have vaccinations in office (must be received elsewhere) Limited availability, please call early in hospitalization  [place] ([address]) [facility]  Charlene Flemming, MD [address], [place] [address] [phone]  Fax [phone]  [facility]  Kimberly Newton, MD, Annamarie Streilein, PA, Carla Hampton, PA [address] [place], [place]  [address] [phone] [facility]  [address], [place], [place] [address] [phone] [address], [place], [place] [address] [phone] (West Office)  [facility] [address], [place], [place] [address] [phone] [facility] [address], [place], [place] [address] [phone] [facility] [address], [place], [place] [address] [phone] [facility] [address], [place], [place] [address] [phone] [facility] [address], [place], [place] [address] [phone] [facility] [address], [place], [place] [address] [phone] [facility]  [address], [place], [place] [address] [phone] [facility] [address], , [place] [address] [phone] [facility] [address], [place], [place] [address] [phone] [facility] [address], , [place] [address] [phone]  

## 2021-05-22 LAB — RPR: RPR Ser Ql: NONREACTIVE

## 2021-05-22 LAB — CBC
Hematocrit: 33.1 % — ABNORMAL LOW (ref 34.0–46.6)
Hemoglobin: 11.3 g/dL (ref 11.1–15.9)
MCH: 32.1 pg (ref 26.6–33.0)
MCHC: 34.1 g/dL (ref 31.5–35.7)
MCV: 94 fL (ref 79–97)
Platelets: 227 10*3/uL (ref 150–450)
RBC: 3.52 x10E6/uL — ABNORMAL LOW (ref 3.77–5.28)
RDW: 12.6 % (ref 11.7–15.4)
WBC: 13.6 10*3/uL — ABNORMAL HIGH (ref 3.4–10.8)

## 2021-05-22 LAB — HEPATITIS C ANTIBODY: Hep C Virus Ab: <0.1 s/co ratio (ref 0.0–0.9)

## 2021-05-22 LAB — HIV ANTIBODY (ROUTINE TESTING W REFLEX): HIV Screen 4th Generation wRfx: NONREACTIVE

## 2021-05-22 LAB — HEPATITIS B SURFACE ANTIGEN: Hepatitis B Surface Ag: NEGATIVE

## 2021-06-04 ENCOUNTER — Other Ambulatory Visit: Payer: Self-pay

## 2021-06-04 ENCOUNTER — Ambulatory Visit (INDEPENDENT_AMBULATORY_CARE_PROVIDER_SITE_OTHER): Payer: Medicaid Other | Admitting: Family Medicine

## 2021-06-04 VITALS — BP 128/77 | HR 97 | Wt 217.0 lb

## 2021-06-04 DIAGNOSIS — O0990 Supervision of high risk pregnancy, unspecified, unspecified trimester: Secondary | ICD-10-CM

## 2021-06-04 DIAGNOSIS — R87612 Low grade squamous intraepithelial lesion on cytologic smear of cervix (LGSIL): Secondary | ICD-10-CM

## 2021-06-04 DIAGNOSIS — R8271 Bacteriuria: Secondary | ICD-10-CM

## 2021-06-04 DIAGNOSIS — O24419 Gestational diabetes mellitus in pregnancy, unspecified control: Secondary | ICD-10-CM

## 2021-06-04 DIAGNOSIS — O10919 Unspecified pre-existing hypertension complicating pregnancy, unspecified trimester: Secondary | ICD-10-CM

## 2021-06-04 NOTE — Progress Notes (Signed)
° °  PRENATAL VISIT NOTE  Subjective:  Tara Chase is a 26 y.o. G1P0000 at [redacted]w[redacted]d being seen today for ongoing prenatal care.  She is currently monitored for the following issues for this high-risk pregnancy and has Low grade squamous intraepithelial lesion (LGSIL) on cervical Pap smear; Supervision of high risk pregnancy, antepartum; GBS bacteriuria; Chronic hypertension affecting pregnancy; and Gestational diabetes on their problem list.  Patient reports no complaints.  Contractions: Not present. Vag. Bleeding: None.  Movement: Present. Denies leaking of fluid.   The following portions of the patient's history were reviewed and updated as appropriate: allergies, current medications, past family history, past medical history, past social history, past surgical history and problem list.   Objective:   Vitals:   06/04/21 0936  BP: 128/77  Pulse: 97  Weight: 217 lb (98.4 kg)    Fetal Status: Fetal Heart Rate (bpm): 150   Movement: Present     General:  Alert, oriented and cooperative. Patient is in no acute distress.  Skin: Skin is warm and dry. No rash noted.   Cardiovascular: Normal heart rate noted  Respiratory: Normal respiratory effort, no problems with respiration noted  Abdomen: Soft, gravid, appropriate for gestational age.  Pain/Pressure: Absent     Pelvic: Cervical exam deferred        Extremities: Normal range of motion.  Edema: Trace  Mental Status: Normal mood and affect. Normal behavior. Normal judgment and thought content.   Assessment and Plan:  Pregnancy: G1P0000 at [redacted]w[redacted]d 1. Supervision of high risk pregnancy, antepartum  2. Chronic hypertension affecting pregnancy BP controlled Serial growth [place] Antenatal testing at 32 weeks  3. Gestational diabetes mellitus (GDM), antepartum, gestational diabetes method of control unspecified Did not bring log - reports CBGs as controlled with a couple elevated BPs.  4. GBS bacteriuria Intrapartum PPx  5. Low grade  squamous intraepithelial lesion (LGSIL) on cervical Pap smear PAP postpartum  Preterm labor symptoms and general obstetric precautions including but not limited to vaginal bleeding, contractions, leaking of fluid and fetal movement were reviewed in detail with the patient. Please refer to After Visit Summary for other counseling recommendations.   No follow-ups on file.  Future Appointments  Date Time Provider [place]  06/07/2021  9:15 AM [facility] NURSE [facility]  06/07/2021  9:30 AM [facility] US3 [facility]  06/18/2021 10:35 AM Truett Mainland, DO [facility] None  07/02/2021  9:35 AM Nehemiah Settle Tanna [place], DO [facility] None    Truett Mainland, DO

## 2021-06-07 ENCOUNTER — Other Ambulatory Visit: Payer: Self-pay

## 2021-06-07 ENCOUNTER — Other Ambulatory Visit: Payer: Self-pay | Admitting: *Deleted

## 2021-06-07 ENCOUNTER — Encounter: Payer: Self-pay | Admitting: *Deleted

## 2021-06-07 ENCOUNTER — Ambulatory Visit: Payer: Medicaid Other | Attending: Obstetrics and Gynecology

## 2021-06-07 ENCOUNTER — Ambulatory Visit: Payer: Medicaid Other | Admitting: *Deleted

## 2021-06-07 VITALS — BP 125/68 | HR 98

## 2021-06-07 DIAGNOSIS — O10919 Unspecified pre-existing hypertension complicating pregnancy, unspecified trimester: Secondary | ICD-10-CM | POA: Insufficient documentation

## 2021-06-07 DIAGNOSIS — O24419 Gestational diabetes mellitus in pregnancy, unspecified control: Secondary | ICD-10-CM

## 2021-06-07 DIAGNOSIS — O10013 Pre-existing essential hypertension complicating pregnancy, third trimester: Secondary | ICD-10-CM | POA: Diagnosis not present

## 2021-06-07 DIAGNOSIS — O10913 Unspecified pre-existing hypertension complicating pregnancy, third trimester: Secondary | ICD-10-CM

## 2021-06-07 DIAGNOSIS — Z3A28 28 weeks gestation of pregnancy: Secondary | ICD-10-CM

## 2021-06-07 DIAGNOSIS — O0990 Supervision of high risk pregnancy, unspecified, unspecified trimester: Secondary | ICD-10-CM | POA: Diagnosis present

## 2021-06-07 DIAGNOSIS — Z6833 Body mass index (BMI) 33.0-33.9, adult: Secondary | ICD-10-CM | POA: Diagnosis present

## 2021-06-14 ENCOUNTER — Other Ambulatory Visit: Payer: Self-pay

## 2021-06-14 ENCOUNTER — Inpatient Hospital Stay (HOSPITAL_COMMUNITY)
Admission: AD | Admit: 2021-06-14 | Discharge: 2021-06-14 | Disposition: A | Discharge status: Home / Self Care | Payer: Medicaid Other | Attending: Obstetrics and Gynecology | Admitting: Obstetrics and Gynecology

## 2021-06-14 DIAGNOSIS — M7989 Other specified soft tissue disorders: Secondary | ICD-10-CM | POA: Diagnosis not present

## 2021-06-14 DIAGNOSIS — M79641 Pain in right hand: Secondary | ICD-10-CM | POA: Diagnosis not present

## 2021-06-14 DIAGNOSIS — O26893 Other specified pregnancy related conditions, third trimester: Secondary | ICD-10-CM | POA: Insufficient documentation

## 2021-06-14 DIAGNOSIS — Z3A29 29 weeks gestation of pregnancy: Secondary | ICD-10-CM | POA: Diagnosis not present

## 2021-06-14 DIAGNOSIS — Z7901 Long term (current) use of anticoagulants: Secondary | ICD-10-CM | POA: Insufficient documentation

## 2021-06-14 DIAGNOSIS — O0990 Supervision of high risk pregnancy, unspecified, unspecified trimester: Secondary | ICD-10-CM

## 2021-06-14 DIAGNOSIS — O10913 Unspecified pre-existing hypertension complicating pregnancy, third trimester: Secondary | ICD-10-CM | POA: Insufficient documentation

## 2021-06-14 DIAGNOSIS — Z3493 Encounter for supervision of normal pregnancy, unspecified, third trimester: Secondary | ICD-10-CM

## 2021-06-14 DIAGNOSIS — M25531 Pain in right wrist: Secondary | ICD-10-CM | POA: Insufficient documentation

## 2021-06-14 MED ORDER — CYCLOBENZAPRINE HCL 10 MG PO TABS: 10.0000 mg | ORAL_TABLET | Freq: Two times a day (BID) | ORAL | 0 refills | Status: DC | PRN

## 2021-06-14 NOTE — MAU Provider Note (Signed)
History     CSN: [id number]  Arrival date and time: 06/14/21 0231   Event Date/Time   First Provider Initiated Contact with Patient 06/14/21 0248      Chief Complaint  Patient presents with   Hand Pain   HPI Tara Chase is a 26 y.o. G1P0000 at 22w4dwho presents to MAU with chief complaint of swelling at her right wrist and hand. This is a problem "that's been going on for some years now" and is unchanged since onset. Frequency is once or twice per year. Hand feels "tight" but grip strength is not impacted. She denies injury to affected area. She states she received a "full workup" 12-18 months ago including imaging "and I was even worked up for Lupus" but all assessments returned normal results. Patient requests pain medication. She took 1G Tylenol around 0100 and experienced some relief.   When the patient called the nurse line to ask about her hand, the phone nurse suggested she check her blood pressure. The patient received a reading of 156/92 on her home cuff. She denies headache, visual disturbances, RUQ/epigastric pain, new onset swelling or weight gain. She did not bring her home cuff with her to MAU.  Patient denies all pregnancy-related complaints including pain, vaginal bleeding, dysuria, fever or recent illness.  She receives prenatal care with [facility]. OB History     Gravida  1   Para  0   Term  0   Preterm  0   AB  0   Living  0      SAB  0   IAB  0   Ectopic  0   Multiple  0   Live Births  0           Past Medical History:  Diagnosis Date   Asthma    Hypertension     Past Surgical History:  Procedure Laterality Date   NO PAST SURGERIES      Family History  Problem Relation Age of Onset   Hypertension Mother    Hypertension Father    Stroke Father    Cancer Maternal Grandfather        prostate   Cancer Paternal Grandfather        colon    Social History   Tobacco Use   Smoking status: Never   Smokeless tobacco: Never   Vaping Use   Vaping Use: Never used  Substance Use Topics   Alcohol use: Not Currently   Drug use: No    Allergies:  Allergies  Allergen Reactions   Pollen Extract Other (See Comments)    Seasonal allergies    Medications Prior to Admission  Medication Sig Dispense Refill Last Dose   Accu-Chek Softclix Lancets lancets DX: O24.419. Check BS QID. 100 each 12    albuterol (PROVENTIL HFA;VENTOLIN HFA) 108 (90 Base) MCG/ACT inhaler Inhale 1-2 puffs into the lungs every 6 (six) hours as needed for wheezing or shortness of breath.      aspirin EC 81 MG tablet Take 81 mg by mouth daily. Swallow whole.      Blood Glucose Monitoring Suppl (ACCU-CHEK GUIDE) w/Device KIT DX: O24.419. Check BS QID. 1 kit 0    cyclobenzaprine (FLEXERIL) 10 MG tablet Take 1 tablet (10 mg total) by mouth 3 (three) times daily as needed for muscle spasms. (Patient not taking: Reported on 06/07/2021) 30 tablet 1    Doxylamine-Pyridoxine (DICLEGIS) 10-10 MG TBEC Take 2 tablets by mouth at bedtime. If symptoms persist,  add one tablet in the morning and one in the afternoon (Patient not taking: Reported on 06/04/2021) 100 tablet 5    glucose blood test strip DX: O24.419. Check BS QID. 100 each 12    labetalol (NORMODYNE) 200 MG tablet Take 400 mg by mouth 2 (two) times daily.      Prenatal Vit-Fe Fumarate-FA (PRENATAL VITAMINS PO) Take by mouth.       Review of Systems  Musculoskeletal:        Swelling and stiff right wrist and hand. Pain with use  All other systems reviewed and are negative. Physical Exam   Blood pressure 138/85, pulse 98, temperature 98.2 F (36.8 C), temperature source Oral, resp. rate 16, height _0  (1.626 m), weight 98.7 kg, last menstrual period 11/19/2020, SpO2 98 %.  Physical Exam Vitals and nursing note reviewed. Exam conducted with a chaperone present.  Constitutional:      Appearance: Normal appearance.  Cardiovascular:     Rate and Rhythm: Normal rate.     Pulses: Normal pulses.   Pulmonary:     Effort: Pulmonary effort is normal.  Abdominal:     Comments: Gravid  Musculoskeletal:     Right hand: Swelling present. Normal range of motion. Normal strength. Normal sensation. Normal capillary refill. Normal pulse.     Left hand: Normal.  Skin:    Capillary Refill: Capillary refill takes less than 2 seconds.  Neurological:     Mental Status: She is alert and oriented to person, place, and time.  Psychiatric:        Mood and Affect: Mood normal.        Behavior: Behavior normal.        Thought Content: Thought content normal.        Judgment: Judgment normal.    MAU Course  Procedures  --Chronic problem without acute exacerbation. Pain previously resolved with Motrin, now small improvement with Tylenol --Patient requests muscle relaxer. Discussed Flexeril as option, though since hand has large composition of tendons and ligaments may not feel desired level of improvement  with this medicine. Patient verbalizes understanding --Reports elevated BP on home cuff, cuff not brought to MAU. BP WNL/well controlled in MAU. No acute symptoms to necessitate Preeclampsia labs. Well controlled on current dose of Labetalol --Patient without ob complaints. Declines NST --Patient has PCP. Advised her to consider the following: Amb referral to Ortho, Physical Therapy, evaluation in Ortho Urgent Care --Advise ice and Tylenol with onset of swelling   Patient Vitals for the past 24 hrs:  BP Temp Temp src Pulse Resp SpO2 Height Weight  06/14/21 0243 138/85 98.2 F (36.8 C) Oral 98 16 98 % _1  (1.626 m) 98.7 kg   Meds ordered this encounter  Medications   cyclobenzaprine (FLEXERIL) 10 MG tablet    Sig: Take 1 tablet (10 mg total) by mouth 2 (two) times daily as needed for muscle spasms.    Dispense:  20 tablet    Refill:  0    Order Specific Question:   Supervising Provider    Answer:   Rip Harbour, MICHAEL L [[id number]]   Assessment and Plan  --26 y.o. G1P0000 at [redacted]w[redacted]d --FHT 158  by Doppler --CHTN on Labetalol --No pregnancy-related concerns --Chronic right hand pain and swelling --[facility]Discharge home in stable condition  SDarlina Rumpf CNM 06/14/2021, 6:10 AM

## 2021-06-14 NOTE — MAU Note (Signed)
Tara Chase is a 26 y.o. at [redacted]w[redacted]d here in MAU reporting: Pt has swollen Right hand. This has happened in the past. Pt checked BP at home and it was elevated 156/92 while on the phone with the nurse. +FM LMP:  Onset of complaint: 05/14/21 Pain score: 7/10 Vitals:   06/14/21 0243  BP: 138/85  Pulse: 98  Resp: 16  Temp: 98.2 F (36.8 C)  SpO2: 98%     FHT:158  Lab orders placed from triage:

## 2021-06-18 ENCOUNTER — Telehealth (INDEPENDENT_AMBULATORY_CARE_PROVIDER_SITE_OTHER): Payer: Medicaid Other | Admitting: Family Medicine

## 2021-06-18 ENCOUNTER — Other Ambulatory Visit: Payer: Self-pay

## 2021-06-18 VITALS — BP 133/79 | HR 99

## 2021-06-18 DIAGNOSIS — O0993 Supervision of high risk pregnancy, unspecified, third trimester: Secondary | ICD-10-CM

## 2021-06-18 DIAGNOSIS — O0990 Supervision of high risk pregnancy, unspecified, unspecified trimester: Secondary | ICD-10-CM

## 2021-06-18 DIAGNOSIS — O24419 Gestational diabetes mellitus in pregnancy, unspecified control: Secondary | ICD-10-CM

## 2021-06-18 DIAGNOSIS — R8271 Bacteriuria: Secondary | ICD-10-CM

## 2021-06-18 DIAGNOSIS — O10919 Unspecified pre-existing hypertension complicating pregnancy, unspecified trimester: Secondary | ICD-10-CM

## 2021-06-18 DIAGNOSIS — Z3A30 30 weeks gestation of pregnancy: Secondary | ICD-10-CM

## 2021-06-18 DIAGNOSIS — O10913 Unspecified pre-existing hypertension complicating pregnancy, third trimester: Secondary | ICD-10-CM

## 2021-06-18 DIAGNOSIS — R87612 Low grade squamous intraepithelial lesion on cytologic smear of cervix (LGSIL): Secondary | ICD-10-CM

## 2021-06-18 DIAGNOSIS — O99820 Streptococcus B carrier state complicating pregnancy: Secondary | ICD-10-CM

## 2021-06-18 DIAGNOSIS — O3443 Maternal care for other abnormalities of cervix, third trimester: Secondary | ICD-10-CM

## 2021-06-18 NOTE — Progress Notes (Addendum)
OBSTETRICS PRENATAL VIRTUAL VISIT ENCOUNTER NOTE  Provider location: [facility]   Patient location: Home  I connected with Tara Chase on 06/21/21 at 10:35 AM EST by MyChart Video Encounter and verified that I am speaking with the correct person using two identifiers. I discussed the limitations, risks, security and privacy concerns of performing an evaluation and management service virtually and the availability of in person appointments. I also discussed with the patient that there may be a patient responsible charge related to this service. The patient expressed understanding and agreed to proceed. Subjective:  Tara Chase is a 26 y.o. G1P0000 at [redacted]w[redacted]d being seen today for ongoing prenatal care.  She is currently monitored for the following issues for this high-risk pregnancy and has Low grade squamous intraepithelial lesion (LGSIL) on cervical Pap smear; Supervision of high risk pregnancy, antepartum; GBS bacteriuria; Chronic hypertension affecting pregnancy; and Gestational diabetes on their problem list.  Patient reports no complaints.  Contractions: Not present. Vag. Bleeding: None.  Movement: Present. Denies any leaking of fluid.   The following portions of the patient's history were reviewed and updated as appropriate: allergies, current medications, past family history, past medical history, past social history, past surgical history and problem list.   Objective:   Vitals:   06/18/21 1035  BP: 133/79  Pulse: 99    Fetal Status:     Movement: Present     General:  Alert, oriented and cooperative. Patient is in no acute distress.  Respiratory: Normal respiratory effort, no problems with respiration noted  Mental Status: Normal mood and affect. Normal behavior. Normal judgment and thought content.  Rest of physical exam deferred due to type of encounter  Imaging: [place] MFM OB FOLLOW UP  Result Date:  06/07/2021 ----------------------------------------------------------------------  OBSTETRICS REPORT                       (Signed Final 06/07/2021 10:16 am) ---------------------------------------------------------------------- Patient Info  ID #:       [id number]                          D.O.B.:  10/30/1995 (25 yrs)  Name:       Tara Chase                Visit Date: 06/07/2021 09:41 am ---------------------------------------------------------------------- Performed By  Attending:        Johnell Comings MD         Ref. Address:     [address]  [place]  Performed By:     Eveline Keto         [facility]                                                             [place] [facility]                                                             [facility]  Referred By:      Truett Mainland                    MD ---------------------------------------------------------------------- Orders  #  Description                           Code        Ordered By  1  [place] MFM OB FOLLOW UP                   [id number]    [place] ----------------------------------------------------------------------  #  Order #                     Accession #                Episode #  1  [id number]                   [id number]                 [id number] ---------------------------------------------------------------------- Indications  Hypertension - Chronic/Pre-existing            O10.019  (labetalol)  Gestational diabetes in pregnancy, diet        O24.410  controlled  [redacted] weeks gestation of pregnancy                [id number]  Obesity complicating pregnancy, second         O99.212  trimester (BMI 33.5)  LR NIPS/Negative Horizon/Negative AFP  Uterine fibroid                                 O34.10 ---------------------------------------------------------------------- Fetal Evaluation  Num Of Fetuses:         1  Fetal Heart Rate(bpm):  158  Cardiac Activity:       Observed  Presentation:           Cephalic  Placenta:               Anterior  P. Cord Insertion:      Previously Visualized  Amniotic Fluid  AFI FV:  Within normal limits  AFI Sum(cm)     %Tile       Largest Pocket(cm)  17.05           63          5.67  RUQ(cm)       RLQ(cm)       LUQ(cm)        LLQ(cm)  4.04          2.75          5.67           4.59 ---------------------------------------------------------------------- Biometry  BPD:      70.6  mm     G. Age:  28w 2d         31  %    CI:        70.48   %    70 - 86                                                          FL/HC:      18.7   %    19.6 - 20.8  HC:      268.1  mm     G. Age:  29w 2d         37  %    HC/AC:      1.11        0.99 - 1.21  AC:      241.7  mm     G. Age:  28w 3d         55  %    FL/BPD:     71.0   %    71 - 87  FL:       50.1  mm     G. Age:  27w 0d        4.6  %    FL/AC:      20.7   %    20 - 24  CER:      35.9  mm     G. Age:  30w 0d         86  %  LV:        3.2  mm  CM:        6.1  mm  Est. FW:    1160  gm      2 lb 9 oz     19  % ---------------------------------------------------------------------- OB History  Blood Type:   O+  Gravidity:    1  Living:       0 ---------------------------------------------------------------------- Gestational Age  LMP:           28w 4d        Date:  11/19/20                 EDD:   08/26/21  U/S Today:     28w 2d                                        EDD:   08/28/21  Best:          28w 4d     Det. By:  LMP  (11/19/20)  EDD:   08/26/21 ---------------------------------------------------------------------- Anatomy  Cranium:               Appears normal         LVOT:                   Appears normal  Cavum:                 Appears normal         Aortic Arch:            Previously seen  Ventricles:             Appears normal         Ductal Arch:            Previously seen  Choroid Plexus:        Previously seen        Diaphragm:              Appears normal  Cerebellum:            Appears normal         Stomach:                Appears normal, left                                                                        sided  Posterior Fossa:       Appears normal         Abdomen:                Previously seen  Nuchal Fold:           Previously seen        Abdominal Wall:         Previously seen  Face:                  Orbits and profile     Cord Vessels:           Previously seen                         previously seen  Lips:                  Appears normal         Kidneys:                Appear normal  Palate:                Previously seen        Bladder:                Appears normal  Thoracic:              Appears normal         Spine:                  Previously seen  with Ltd views  Heart:                 Appears normal         Upper Extremities:      Previously seen                         (4CH, axis, and                         situs)  RVOT:                  Appears normal         Lower Extremities:      Previously seen  Other:  Female gender previously seen. Lenses, Nasal bone, VC, 3VV and          3VTV, heels/feet and open hands/5th digits visualized previously. ---------------------------------------------------------------------- Cervix Uterus Adnexa  Cervix  Not visualized (advanced GA >24wks)  Uterus  No abnormality visualized.  Right Ovary  Within normal limits.  Left Ovary  Within normal limits. ---------------------------------------------------------------------- Myomas  Site                     L(cm)      W(cm)      D(cm)       Location  Anterior Mid             2.3        3          2.2 ----------------------------------------------------------------------  Blood Flow                  RI       PI       Comments  ---------------------------------------------------------------------- Comments  This patient was seen for a follow up growth scan due to  maternal obesity, chronic hypertension treated with labetalol  400 mg twice a day, and diet-controlled gestational diabetes.  She denies any problems since her last exam and reports  good glycemic control.  She was informed that the fetal growth and amniotic fluid  level appears appropriate for her gestational age.  Due to gestational diabetes and chronic hypertension, we will  start weekly fetal testing at 32 weeks.  A BPP was scheduled in 3 weeks. ----------------------------------------------------------------------                   Johnell Comings, MD Electronically Signed Final Report   06/07/2021 10:16 am ----------------------------------------------------------------------   Assessment and Plan:  Pregnancy: G1P0000 at [redacted]w[redacted]d 1. Supervision of high risk pregnancy, antepartum FHT and FH normal  2. [redacted] weeks gestation of pregnancy  3. Chronic hypertension affecting pregnancy BP controlled On ASA 81mg  EFW 19% Antenatal testing   4. Gestational diabetes mellitus (GDM), antepartum, gestational diabetes method of control unspecified Diet controlled On ASA 81mg   5. GBS bacteriuria Intrapartum PPx.  6. Low grade squamous intraepithelial lesion (LGSIL) on cervical Pap smear PAP postpartum  Preterm labor symptoms and general obstetric precautions including but not limited to vaginal bleeding, contractions, leaking of fluid and fetal movement were reviewed in detail with the patient. I discussed the assessment and treatment plan with the patient. The patient was provided an opportunity to ask questions and all were answered. The patient agreed with the plan and demonstrated an understanding of the instructions. The patient was advised to call back or seek an in-person office evaluation/go to MAU at [place] for  any urgent or concerning  symptoms. Please refer to After Visit Summary for other counseling recommendations.   I provided 13 minutes of face-to-face time during this encounter.  No follow-ups on file.  Future Appointments  Date Time Provider [place]  06/28/2021 11:15 AM [facility] NST [facility]  07/02/2021  9:35 AM Truett Mainland, DO [facility] None  07/06/2021  8:30 AM [facility] NURSE [facility]  07/06/2021  8:45 AM [facility] US4 [facility]  07/12/2021 11:15 AM [facility] NST [facility] [place] [facility] [employer] [place]

## 2021-06-28 ENCOUNTER — Other Ambulatory Visit: Payer: Medicaid Other

## 2021-06-29 ENCOUNTER — Ambulatory Visit (INDEPENDENT_AMBULATORY_CARE_PROVIDER_SITE_OTHER): Payer: Medicaid Other | Admitting: General Practice

## 2021-06-29 ENCOUNTER — Other Ambulatory Visit: Payer: Self-pay

## 2021-06-29 ENCOUNTER — Ambulatory Visit (INDEPENDENT_AMBULATORY_CARE_PROVIDER_SITE_OTHER): Payer: Medicaid Other

## 2021-06-29 VITALS — BP 128/77 | HR 98

## 2021-06-29 DIAGNOSIS — O10919 Unspecified pre-existing hypertension complicating pregnancy, unspecified trimester: Secondary | ICD-10-CM | POA: Diagnosis not present

## 2021-06-29 NOTE — Progress Notes (Signed)
Pt informed that the ultrasound is considered a limited OB ultrasound and is not intended to be a complete ultrasound exam.  Patient also informed that the ultrasound is not being completed with the intent of assessing for fetal or placental anomalies or any pelvic abnormalities.  Explained that the purpose of todays ultrasound is to assess for  BPP, presentation, and AFI.  Patient acknowledges the purpose of the exam and the limitations of the study.     Chase Caller RN BSN 06/29/21

## 2021-07-02 ENCOUNTER — Other Ambulatory Visit: Payer: Self-pay

## 2021-07-02 ENCOUNTER — Ambulatory Visit (INDEPENDENT_AMBULATORY_CARE_PROVIDER_SITE_OTHER): Payer: Medicaid Other | Admitting: Family Medicine

## 2021-07-02 VITALS — BP 137/78 | HR 100 | Wt 215.0 lb

## 2021-07-02 DIAGNOSIS — R87612 Low grade squamous intraepithelial lesion on cytologic smear of cervix (LGSIL): Secondary | ICD-10-CM

## 2021-07-02 DIAGNOSIS — O24419 Gestational diabetes mellitus in pregnancy, unspecified control: Secondary | ICD-10-CM

## 2021-07-02 DIAGNOSIS — O0990 Supervision of high risk pregnancy, unspecified, unspecified trimester: Secondary | ICD-10-CM

## 2021-07-02 DIAGNOSIS — R8271 Bacteriuria: Secondary | ICD-10-CM

## 2021-07-02 DIAGNOSIS — Z3A32 32 weeks gestation of pregnancy: Secondary | ICD-10-CM

## 2021-07-02 DIAGNOSIS — O10919 Unspecified pre-existing hypertension complicating pregnancy, unspecified trimester: Secondary | ICD-10-CM

## 2021-07-02 NOTE — Progress Notes (Signed)
? ?  PRENATAL VISIT NOTE ? ?Subjective:  ?Tara Chase is a 26 y.o. G1P0000 at [redacted]w[redacted]d being seen today for ongoing prenatal care.  She is currently monitored for the following issues for this high-risk pregnancy and has Low grade squamous intraepithelial lesion (LGSIL) on cervical Pap smear; Supervision of high risk pregnancy, antepartum; GBS bacteriuria; Chronic hypertension affecting pregnancy; and Gestational diabetes on their problem list. ? ?Patient reports no complaints. Had vasovagal symptoms last week when getting hair done. Contractions: Not present. Vag. Bleeding: None.  Movement: Present. Denies leaking of fluid.  ? ?The following portions of the patient's history were reviewed and updated as appropriate: allergies, current medications, past family history, past medical history, past social history, past surgical history and problem list.  ? ?Objective:  ? ?Vitals:  ? 07/02/21 0933  ?BP: 137/78  ?Pulse: 100  ?Weight: 215 lb (97.5 kg)  ? ? ?Fetal Status:     Movement: Present    ? ?General:  Alert, oriented and cooperative. Patient is in no acute distress.  ?Skin: Skin is warm and dry. No rash noted.   ?Cardiovascular: Normal heart rate noted  ?Respiratory: Normal respiratory effort, no problems with respiration noted  ?Abdomen: Soft, gravid, appropriate for gestational age.  Pain/Pressure: Absent     ?Pelvic: Cervical exam deferred        ?Extremities: Normal range of motion.  Edema: Trace  ?Mental Status: Normal mood and affect. Normal behavior. Normal judgment and thought content.  ? ?Assessment and Plan:  ?Pregnancy: G1P0000 at [redacted]w[redacted]d ?1. [redacted] weeks gestation of pregnancy ? ?2. Supervision of high risk pregnancy, antepartum ?FHT and FH normal ? ?3. Chronic hypertension affecting pregnancy ?BP controlled ? ?4. Gestational diabetes mellitus (GDM), antepartum, gestational diabetes method of control unspecified ?EFW 19% on 2/6 ?CBGs mostly control - some elevated after dinner ? ?5. GBS  bacteriuria ?Intrapartum ppx ? ?6. Low grade squamous intraepithelial lesion (LGSIL) on cervical Pap smear ?PAP postpartum ? ?Preterm labor symptoms and general obstetric precautions including but not limited to vaginal bleeding, contractions, leaking of fluid and fetal movement were reviewed in detail with the patient. ?Please refer to After Visit Summary for other counseling recommendations.  ? ?No follow-ups on file. ? ?Future Appointments  ?Date Time Provider [place]  ?07/06/2021  8:30 AM [facility] NURSE [facility]  ?07/06/2021  8:45 AM [facility] US4 [facility]  ?07/12/2021 11:15 AM [facility] NST [facility]  ?07/19/2021  9:15 AM [facility] NST [facility]  ?07/26/2021  9:15 AM [facility] NST [facility]  ?08/02/2021  9:15 AM [facility] NST [facility]  ?08/09/2021 10:15 AM [facility] NST [facility]  ?08/16/2021  9:15 AM [facility] NST [facility]  ?08/23/2021  9:15 AM [facility] NST [facility]  ? ? ?Truett Mainland, DO ?

## 2021-07-06 ENCOUNTER — Other Ambulatory Visit: Payer: Self-pay | Admitting: *Deleted

## 2021-07-06 ENCOUNTER — Other Ambulatory Visit: Payer: Self-pay

## 2021-07-06 ENCOUNTER — Ambulatory Visit: Payer: Medicaid Other | Admitting: *Deleted

## 2021-07-06 ENCOUNTER — Ambulatory Visit: Payer: Medicaid Other | Attending: Obstetrics

## 2021-07-06 VITALS — BP 115/63 | HR 102

## 2021-07-06 DIAGNOSIS — O10913 Unspecified pre-existing hypertension complicating pregnancy, third trimester: Secondary | ICD-10-CM | POA: Diagnosis not present

## 2021-07-06 DIAGNOSIS — O0990 Supervision of high risk pregnancy, unspecified, unspecified trimester: Secondary | ICD-10-CM | POA: Diagnosis present

## 2021-07-06 DIAGNOSIS — O10013 Pre-existing essential hypertension complicating pregnancy, third trimester: Secondary | ICD-10-CM | POA: Diagnosis not present

## 2021-07-06 DIAGNOSIS — O24419 Gestational diabetes mellitus in pregnancy, unspecified control: Secondary | ICD-10-CM | POA: Diagnosis present

## 2021-07-06 DIAGNOSIS — O24410 Gestational diabetes mellitus in pregnancy, diet controlled: Secondary | ICD-10-CM

## 2021-07-06 DIAGNOSIS — Z3A32 32 weeks gestation of pregnancy: Secondary | ICD-10-CM

## 2021-07-06 DIAGNOSIS — Z362 Encounter for other antenatal screening follow-up: Secondary | ICD-10-CM | POA: Diagnosis not present

## 2021-07-12 ENCOUNTER — Other Ambulatory Visit: Payer: Self-pay

## 2021-07-12 ENCOUNTER — Ambulatory Visit: Payer: Medicaid Other | Admitting: *Deleted

## 2021-07-12 ENCOUNTER — Ambulatory Visit (INDEPENDENT_AMBULATORY_CARE_PROVIDER_SITE_OTHER): Payer: Medicaid Other

## 2021-07-12 VITALS — BP 129/78 | HR 101

## 2021-07-12 DIAGNOSIS — O10919 Unspecified pre-existing hypertension complicating pregnancy, unspecified trimester: Secondary | ICD-10-CM

## 2021-07-12 DIAGNOSIS — O24410 Gestational diabetes mellitus in pregnancy, diet controlled: Secondary | ICD-10-CM

## 2021-07-12 NOTE — Progress Notes (Signed)

## 2021-07-15 ENCOUNTER — Other Ambulatory Visit: Payer: Self-pay

## 2021-07-15 ENCOUNTER — Ambulatory Visit (INDEPENDENT_AMBULATORY_CARE_PROVIDER_SITE_OTHER): Payer: Medicaid Other | Admitting: Family Medicine

## 2021-07-15 VITALS — BP 123/83 | HR 100

## 2021-07-15 DIAGNOSIS — O10919 Unspecified pre-existing hypertension complicating pregnancy, unspecified trimester: Secondary | ICD-10-CM

## 2021-07-15 DIAGNOSIS — O24419 Gestational diabetes mellitus in pregnancy, unspecified control: Secondary | ICD-10-CM

## 2021-07-15 DIAGNOSIS — R87612 Low grade squamous intraepithelial lesion on cytologic smear of cervix (LGSIL): Secondary | ICD-10-CM

## 2021-07-15 DIAGNOSIS — O0990 Supervision of high risk pregnancy, unspecified, unspecified trimester: Secondary | ICD-10-CM

## 2021-07-15 DIAGNOSIS — R8271 Bacteriuria: Secondary | ICD-10-CM

## 2021-07-15 MED ORDER — LABETALOL HCL 200 MG PO TABS: 400.0000 mg | ORAL_TABLET | Freq: Two times a day (BID) | ORAL | 2 refills | Status: DC

## 2021-07-15 NOTE — Progress Notes (Signed)
? ?  PRENATAL VISIT NOTE ? ?Subjective:  ?Tara Chase is a 26 y.o. G1P0000 at [redacted]w[redacted]d being seen today for ongoing prenatal care.  She is currently monitored for the following issues for this high-risk pregnancy and has Low grade squamous intraepithelial lesion (LGSIL) on cervical Pap smear; Supervision of high risk pregnancy, antepartum; GBS bacteriuria; Chronic hypertension affecting pregnancy; and Gestational diabetes on their problem list. ? ?Patient reports no complaints.  Contractions: Not present. Vag. Bleeding: None.  Movement: Present. Denies leaking of fluid.  ? ?The following portions of the patient's history were reviewed and updated as appropriate: allergies, current medications, past family history, past medical history, past social history, past surgical history and problem list.  ? ?Objective:  ? ?Vitals:  ? 07/15/21 1316  ?BP: 123/83  ?Pulse: 100  ? ? ?Fetal Status: Fetal Heart Rate (bpm): 140   Movement: Present    ? ?General:  Alert, oriented and cooperative. Patient is in no acute distress.  ?Skin: Skin is warm and dry. No rash noted.   ?Cardiovascular: Normal heart rate noted  ?Respiratory: Normal respiratory effort, no problems with respiration noted  ?Abdomen: Soft, gravid, appropriate for gestational age.  Pain/Pressure: Absent     ?Pelvic: Cervical exam deferred        ?Extremities: Normal range of motion.  Edema: Trace  ?Mental Status: Normal mood and affect. Normal behavior. Normal judgment and thought content.  ? ?Assessment and Plan:  ?Pregnancy: G1P0000 at [redacted]w[redacted]d ?1. Supervision of high risk pregnancy, antepartum ?FHT and FH normal ? ?2. Chronic hypertension affecting pregnancy ?Stable on labetalol 400mg  BID ?Taking ASA 81mg  ?Continue growth [place] and BPP ? ?3. Gestational diabetes mellitus (GDM), antepartum, gestational diabetes method of control unspecified ?Did not bring CBGs today but reports controlled. ? ?4. GBS bacteriuria ?Intrapartum ppx. ? ?5. Low grade squamous intraepithelial  lesion (LGSIL) on cervical Pap smear ? ? ?Preterm labor symptoms and general obstetric precautions including but not limited to vaginal bleeding, contractions, leaking of fluid and fetal movement were reviewed in detail with the patient. ?Please refer to After Visit Summary for other counseling recommendations.  ? ?No follow-ups on file. ? ?Future Appointments  ?Date Time Provider [place]  ?07/19/2021  9:15 AM [facility] NST [facility]  ?07/26/2021  9:15 AM [facility] NST [facility]  ?07/30/2021  9:35 AM Nehemiah Settle Tanna Savoy, DO [facility] None  ?08/03/2021  8:15 AM [facility] NURSE [facility]  ?08/03/2021  8:30 AM [facility] US3 [facility]  ?08/05/2021 10:15 AM Truett Mainland, DO [facility] None  ?08/09/2021 10:15 AM [facility] NST [facility]  ?08/12/2021 11:15 AM Anyanwu, Sallyanne Havers, MD [facility] None  ?08/16/2021  9:15 AM [facility] NST [facility]  ?08/20/2021 10:55 AM Nehemiah Settle, Tanna Savoy, DO [facility] None  ?08/23/2021  9:15 AM [facility] NST [facility]  ?08/26/2021  8:35 AM Nehemiah Settle Tanna Savoy, DO [facility] None  ? ? ?Truett Mainland, DO ?

## 2021-07-19 ENCOUNTER — Other Ambulatory Visit: Payer: Self-pay

## 2021-07-19 ENCOUNTER — Ambulatory Visit (INDEPENDENT_AMBULATORY_CARE_PROVIDER_SITE_OTHER): Payer: Medicaid Other

## 2021-07-19 ENCOUNTER — Ambulatory Visit: Payer: Medicaid Other | Admitting: *Deleted

## 2021-07-19 VITALS — BP 117/77 | HR 103

## 2021-07-19 DIAGNOSIS — O10919 Unspecified pre-existing hypertension complicating pregnancy, unspecified trimester: Secondary | ICD-10-CM | POA: Diagnosis not present

## 2021-07-19 DIAGNOSIS — O24410 Gestational diabetes mellitus in pregnancy, diet controlled: Secondary | ICD-10-CM

## 2021-07-19 NOTE — Progress Notes (Signed)

## 2021-07-26 ENCOUNTER — Other Ambulatory Visit: Payer: Self-pay

## 2021-07-26 ENCOUNTER — Ambulatory Visit: Payer: Medicaid Other | Admitting: *Deleted

## 2021-07-26 ENCOUNTER — Ambulatory Visit (INDEPENDENT_AMBULATORY_CARE_PROVIDER_SITE_OTHER): Payer: 59

## 2021-07-26 VITALS — BP 139/85 | HR 103

## 2021-07-26 DIAGNOSIS — O10919 Unspecified pre-existing hypertension complicating pregnancy, unspecified trimester: Secondary | ICD-10-CM

## 2021-07-26 DIAGNOSIS — O24410 Gestational diabetes mellitus in pregnancy, diet controlled: Secondary | ICD-10-CM | POA: Diagnosis not present

## 2021-07-26 NOTE — Progress Notes (Signed)

## 2021-07-29 ENCOUNTER — Encounter: Payer: Self-pay | Admitting: General Practice

## 2021-07-29 LAB — OB RESULTS CONSOLE GBS: GBS: POSITIVE

## 2021-07-30 ENCOUNTER — Other Ambulatory Visit (HOSPITAL_COMMUNITY)
Admission: RE | Admit: 2021-07-30 | Discharge: 2021-07-30 | Disposition: A | Discharge status: Home / Self Care | Payer: 59 | Source: Ambulatory Visit | Attending: Family Medicine | Admitting: Family Medicine

## 2021-07-30 ENCOUNTER — Ambulatory Visit (INDEPENDENT_AMBULATORY_CARE_PROVIDER_SITE_OTHER): Payer: Medicaid Other | Admitting: Family Medicine

## 2021-07-30 VITALS — BP 132/88 | HR 102 | Wt 220.0 lb

## 2021-07-30 DIAGNOSIS — O0990 Supervision of high risk pregnancy, unspecified, unspecified trimester: Secondary | ICD-10-CM

## 2021-07-30 DIAGNOSIS — O24419 Gestational diabetes mellitus in pregnancy, unspecified control: Secondary | ICD-10-CM

## 2021-07-30 DIAGNOSIS — O10919 Unspecified pre-existing hypertension complicating pregnancy, unspecified trimester: Secondary | ICD-10-CM

## 2021-07-30 DIAGNOSIS — Z3A36 36 weeks gestation of pregnancy: Secondary | ICD-10-CM | POA: Insufficient documentation

## 2021-07-30 DIAGNOSIS — R8271 Bacteriuria: Secondary | ICD-10-CM

## 2021-07-30 DIAGNOSIS — Z3493 Encounter for supervision of normal pregnancy, unspecified, third trimester: Secondary | ICD-10-CM | POA: Insufficient documentation

## 2021-07-30 LAB — OB RESULTS CONSOLE GC/CHLAMYDIA: Gonorrhea: NEGATIVE

## 2021-07-30 MED ORDER — METFORMIN HCL 500 MG PO TABS: 500.0000 mg | ORAL_TABLET | Freq: Every day | ORAL | 3 refills | Status: DC

## 2021-07-30 MED ORDER — LABETALOL HCL 200 MG PO TABS: 400.0000 mg | ORAL_TABLET | Freq: Three times a day (TID) | ORAL | 2 refills | Status: DC

## 2021-07-30 NOTE — Progress Notes (Signed)
[place] ?

## 2021-07-30 NOTE — Progress Notes (Signed)
? ?  PRENATAL VISIT NOTE ? ?Subjective:  ?Tara Chase is a 26 y.o. G1P0000 at [redacted]w[redacted]d being seen today for ongoing prenatal care.  She is currently monitored for the following issues for this high-risk pregnancy and has Low grade squamous intraepithelial lesion (LGSIL) on cervical Pap smear; Supervision of high risk pregnancy, antepartum; GBS bacteriuria; Chronic hypertension affecting pregnancy; and Gestational diabetes on their problem list. ? ?Patient reports no complaints.  Contractions: Not present. Vag. Bleeding: None.  Movement: Present. Denies leaking of fluid.  ? ?The following portions of the patient's history were reviewed and updated as appropriate: allergies, current medications, past family history, past medical history, past social history, past surgical history and problem list.  ? ?Objective:  ? ?Vitals:  ? 07/30/21 0940  ?BP: 132/88  ?Pulse: (!) 102  ?Weight: 220 lb (99.8 kg)  ? ? ?Fetal Status: Fetal Heart Rate (bpm): 132   Movement: Present    ? ?General:  Alert, oriented and cooperative. Patient is in no acute distress.  ?Skin: Skin is warm and dry. No rash noted.   ?Cardiovascular: Normal heart rate noted  ?Respiratory: Normal respiratory effort, no problems with respiration noted  ?Abdomen: Soft, gravid, appropriate for gestational age.  Pain/Pressure: Present     ?Pelvic: Cervical exam deferred        ?Extremities: Normal range of motion.  Edema: None  ?Mental Status: Normal mood and affect. Normal behavior. Normal judgment and thought content.  ? ?Assessment and Plan:  ?Pregnancy: G1P0000 at [redacted]w[redacted]d ?1. [redacted] weeks gestation of pregnancy ?- GC/Chlamydia probe amp ([place])not at [facility] ? ?2. Supervision of high risk pregnancy, antepartum ?FHT and FH normal ? ?3. Gestational diabetes mellitus (GDM), antepartum, gestational diabetes method of control unspecified ?Fasting normal ?PP 130s ?Start metformin qAM ? ?4. Chronic hypertension affecting pregnancy ?Slightly elevated BP ?Increase labetalol  to 400mg  TID. ? ?5. GBS bacteriuria ?Intrapartum PPx. ? ? ?Preterm labor symptoms and general obstetric precautions including but not limited to vaginal bleeding, contractions, leaking of fluid and fetal movement were reviewed in detail with the patient. ?Please refer to After Visit Summary for other counseling recommendations.  ? ?No follow-ups on file. ? ?Future Appointments  ?Date Time Provider [place]  ?08/03/2021  8:15 AM [facility] NURSE [facility] WMC  ?08/03/2021  8:30 AM [facility] US3 [facility]-MFCUS WMC  ?08/05/2021 10:15 AM Truett Mainland, DO [facility] None  ?08/09/2021 10:15 AM WMC-WOCA NST [facility]MC-C[facility] WMC  ?08/12/2021 11:15 AM Anyanwu, Sallyanne Havers, MD [facility] None  ?08/16/2021  9:15 AM [facility]MC-WOCA NST [facility] WMC  ?08/20/2021 10:55 AM Nehemiah Settle, Tanna Savoy, DO [facility] None  ?08/23/2021  9:15 AM [facility]A NST [facility] WMC  ?08/26/2021  8:35 AM Nehemiah Settle Tanna Savoy, DO [facility] None  ? ? ?Truett Mainland, DO ? ?

## 2021-08-02 ENCOUNTER — Other Ambulatory Visit: Payer: Medicaid Other

## 2021-08-02 ENCOUNTER — Encounter: Payer: Self-pay | Admitting: General Practice

## 2021-08-02 LAB — GC/CHLAMYDIA PROBE AMP (~~LOC~~) NOT AT ARMC
Chlamydia: NEGATIVE
Comment: NEGATIVE
Comment: NORMAL
Neisseria Gonorrhea: NEGATIVE

## 2021-08-03 ENCOUNTER — Ambulatory Visit: Payer: 59 | Admitting: *Deleted

## 2021-08-03 ENCOUNTER — Ambulatory Visit: Payer: 59 | Attending: Maternal & Fetal Medicine

## 2021-08-03 ENCOUNTER — Encounter: Payer: Self-pay | Admitting: *Deleted

## 2021-08-03 VITALS — BP 136/87 | HR 105

## 2021-08-03 DIAGNOSIS — Z3A36 36 weeks gestation of pregnancy: Secondary | ICD-10-CM

## 2021-08-03 DIAGNOSIS — O0990 Supervision of high risk pregnancy, unspecified, unspecified trimester: Secondary | ICD-10-CM | POA: Insufficient documentation

## 2021-08-03 DIAGNOSIS — O24410 Gestational diabetes mellitus in pregnancy, diet controlled: Secondary | ICD-10-CM

## 2021-08-03 DIAGNOSIS — O10013 Pre-existing essential hypertension complicating pregnancy, third trimester: Secondary | ICD-10-CM

## 2021-08-03 DIAGNOSIS — Z362 Encounter for other antenatal screening follow-up: Secondary | ICD-10-CM

## 2021-08-03 DIAGNOSIS — O99213 Obesity complicating pregnancy, third trimester: Secondary | ICD-10-CM | POA: Diagnosis not present

## 2021-08-03 DIAGNOSIS — O10913 Unspecified pre-existing hypertension complicating pregnancy, third trimester: Secondary | ICD-10-CM | POA: Insufficient documentation

## 2021-08-03 DIAGNOSIS — O24419 Gestational diabetes mellitus in pregnancy, unspecified control: Secondary | ICD-10-CM | POA: Insufficient documentation

## 2021-08-03 DIAGNOSIS — E669 Obesity, unspecified: Secondary | ICD-10-CM | POA: Diagnosis not present

## 2021-08-05 ENCOUNTER — Telehealth (HOSPITAL_COMMUNITY): Payer: Self-pay | Admitting: *Deleted

## 2021-08-05 ENCOUNTER — Ambulatory Visit (INDEPENDENT_AMBULATORY_CARE_PROVIDER_SITE_OTHER): Payer: Medicaid Other | Admitting: Family Medicine

## 2021-08-05 VITALS — BP 134/89 | HR 101 | Wt 221.0 lb

## 2021-08-05 DIAGNOSIS — O10919 Unspecified pre-existing hypertension complicating pregnancy, unspecified trimester: Secondary | ICD-10-CM

## 2021-08-05 DIAGNOSIS — O0990 Supervision of high risk pregnancy, unspecified, unspecified trimester: Secondary | ICD-10-CM

## 2021-08-05 DIAGNOSIS — O24419 Gestational diabetes mellitus in pregnancy, unspecified control: Secondary | ICD-10-CM

## 2021-08-05 DIAGNOSIS — R8271 Bacteriuria: Secondary | ICD-10-CM

## 2021-08-05 NOTE — Telephone Encounter (Signed)
Preadmission screen  

## 2021-08-05 NOTE — Progress Notes (Signed)
? ?  PRENATAL VISIT NOTE ? ?Subjective:  ?Tara Chase is a 26 y.o. G1P0000 at [redacted]w[redacted]d being seen today for ongoing prenatal care.  She is currently monitored for the following issues for this high-risk pregnancy and has Low grade squamous intraepithelial lesion (LGSIL) on cervical Pap smear; Supervision of high risk pregnancy, antepartum; GBS bacteriuria; Chronic hypertension affecting pregnancy; and Gestational diabetes on their problem list. ? ?Patient reports no complaints.  Contractions: Not present. Vag. Bleeding: None.  Movement: Present. Denies leaking of fluid.  ? ?The following portions of the patient's history were reviewed and updated as appropriate: allergies, current medications, past family history, past medical history, past social history, past surgical history and problem list.  ? ?Objective:  ? ?Vitals:  ? 08/05/21 1012  ?BP: 134/89  ?Pulse: (!) 101  ?Weight: 221 lb (100.2 kg)  ? ? ?Fetal Status: Fetal Heart Rate (bpm): 138   Movement: Present    ? ?General:  Alert, oriented and cooperative. Patient is in no acute distress.  ?Skin: Skin is warm and dry. No rash noted.   ?Cardiovascular: Normal heart rate noted  ?Respiratory: Normal respiratory effort, no problems with respiration noted  ?Abdomen: Soft, gravid, appropriate for gestational age.  Pain/Pressure: Absent     ?Pelvic: Cervical exam deferred        ?Extremities: Normal range of motion.  Edema: None  ?Mental Status: Normal mood and affect. Normal behavior. Normal judgment and thought content.  ? ?Assessment and Plan:  ?Pregnancy: G1P0000 at [redacted]w[redacted]d ? ?1. Supervision of high risk pregnancy, antepartum ?FHT and FH normal ? ?2. Gestational diabetes mellitus (GDM), antepartum, gestational diabetes method of control unspecified ?Improved with metformin.  ?Fastins controlled ?2hr pp 110s ? ?3. Chronic hypertension affecting pregnancy ?Controlled ?Normal growth: 19% ?BPP normal ? ? ?4. GBS bacteriuria ?Intrapartum PPX ? ? ?Term labor symptoms and  general obstetric precautions including but not limited to vaginal bleeding, contractions, leaking of fluid and fetal movement were reviewed in detail with the patient. ?Please refer to After Visit Summary for other counseling recommendations.  ? ?No follow-ups on file. ? ?Future Appointments  ?Date Time Provider [place]  ?08/09/2021 10:15 AM W[facility]-WOCA NST WMC-C[facility] WMC  ?08/12/2021 11:15 AM Anyanwu, Sallyanne Havers, MD [facility] None  ?08/16/2021  9:15 AM WMC-WOCA NST WMC-C[facility] WMC  ?08/19/2021  6:30 AM MC-LD SCHED ROOM MC-INDC None  ?09/23/2021  1:30 PM Truett Mainland, DO [facility] None  ? ? ?Truett Mainland, DO ?

## 2021-08-06 ENCOUNTER — Telehealth (HOSPITAL_COMMUNITY): Payer: Self-pay | Admitting: *Deleted

## 2021-08-06 ENCOUNTER — Encounter (HOSPITAL_COMMUNITY): Payer: Self-pay | Admitting: *Deleted

## 2021-08-06 NOTE — Telephone Encounter (Signed)
Preadmission screen  

## 2021-08-09 ENCOUNTER — Ambulatory Visit: Payer: Medicaid Other | Admitting: *Deleted

## 2021-08-09 ENCOUNTER — Ambulatory Visit (INDEPENDENT_AMBULATORY_CARE_PROVIDER_SITE_OTHER): Payer: 59

## 2021-08-09 VITALS — BP 125/77 | HR 105

## 2021-08-09 DIAGNOSIS — O10919 Unspecified pre-existing hypertension complicating pregnancy, unspecified trimester: Secondary | ICD-10-CM

## 2021-08-09 DIAGNOSIS — O24410 Gestational diabetes mellitus in pregnancy, diet controlled: Secondary | ICD-10-CM

## 2021-08-09 DIAGNOSIS — O99213 Obesity complicating pregnancy, third trimester: Secondary | ICD-10-CM

## 2021-08-09 NOTE — Progress Notes (Signed)

## 2021-08-10 NOTE — Progress Notes (Signed)
NST:  Baseline: 130 bpm, Variability: Good {> 6 bpm), Accelerations: Reactive, and Decelerations: Absent  Patient seen and assessed by nursing staff.  Agree with documentation and plan.   

## 2021-08-12 ENCOUNTER — Ambulatory Visit (INDEPENDENT_AMBULATORY_CARE_PROVIDER_SITE_OTHER): Payer: Medicaid Other | Admitting: Obstetrics & Gynecology

## 2021-08-12 VITALS — BP 119/74 | HR 105 | Wt 218.0 lb

## 2021-08-12 DIAGNOSIS — O10919 Unspecified pre-existing hypertension complicating pregnancy, unspecified trimester: Secondary | ICD-10-CM

## 2021-08-12 DIAGNOSIS — O24415 Gestational diabetes mellitus in pregnancy, controlled by oral hypoglycemic drugs: Secondary | ICD-10-CM

## 2021-08-12 DIAGNOSIS — O0990 Supervision of high risk pregnancy, unspecified, unspecified trimester: Secondary | ICD-10-CM

## 2021-08-12 DIAGNOSIS — Z3A38 38 weeks gestation of pregnancy: Secondary | ICD-10-CM

## 2021-08-12 DIAGNOSIS — R8271 Bacteriuria: Secondary | ICD-10-CM

## 2021-08-12 LAB — POCT URINALYSIS DIPSTICK
Bilirubin, UA: NEGATIVE
Blood, UA: NEGATIVE
Glucose, UA: NEGATIVE
Ketones, UA: NEGATIVE
Leukocytes, UA: NEGATIVE
Nitrite, UA: NEGATIVE
Protein, UA: NEGATIVE
Spec Grav, UA: 1.02 (ref 1.010–1.025)
Urobilinogen, UA: 0.2 E.U./dL
pH, UA: 6.5 (ref 5.0–8.0)

## 2021-08-12 NOTE — Progress Notes (Signed)
? ?  PRENATAL VISIT NOTE ? ?Subjective:  ?Tara Chase is a 26 y.o. G1P0000 at [redacted]w[redacted]d being seen today for ongoing prenatal care.  She is currently monitored for the following issues for this high-risk pregnancy and has Low grade squamous intraepithelial lesion (LGSIL) on cervical Pap smear; Supervision of high risk pregnancy, antepartum; GBS bacteriuria; Chronic hypertension affecting pregnancy; and Gestational diabetes on their problem list. ? ?Patient reports no complaints.  Contractions: Not present. Vag. Bleeding: None.  Movement: Present. Denies leaking of fluid.  ? ?The following portions of the patient's history were reviewed and updated as appropriate: allergies, current medications, past family history, past medical history, past social history, past surgical history and problem list.  ? ?Objective:  ? ?Vitals:  ? 08/12/21 1118 08/12/21 1123  ?BP: (!) 141/82 119/74  ?Pulse: (!) 105 (!) 105  ?Weight: 218 lb (98.9 kg)   ? ? ?Fetal Status: Fetal Heart Rate (bpm): 146   Movement: Present  Presentation: Vertex ? ?General:  Alert, oriented and cooperative. Patient is in no acute distress.  ?Skin: Skin is warm and dry. No rash noted.   ?Cardiovascular: Normal heart rate noted  ?Respiratory: Normal respiratory effort, no problems with respiration noted  ?Abdomen: Soft, gravid, appropriate for gestational age.  Pain/Pressure: Absent     ?Pelvic: Cervical exam deferred Dilation: Closed Effacement (%): Thick Station: Ballotable  ?Extremities: Normal range of motion.  Edema: None  ?Mental Status: Normal mood and affect. Normal behavior. Normal judgment and thought content.  ? ?Assessment and Plan:  ?Pregnancy: G1P0000 at [redacted]w[redacted]d ?1. Metformin controlled gestational diabetes mellitus (GDM), antepartum ?Patient reports CBGs within range. Continue Metformin.  IOL scheduled at 39 weeks. ? ?2. Chronic hypertension affecting pregnancy ?BP stable on Labetalol.  IOL scheduled next week. Preeclampsia precautions  reviewed. ? ?3. GBS bacteriuria ?Will get prophylaxis in labor ? ?4. [redacted] weeks gestation of pregnancy ?5. Supervision of high risk pregnancy, antepartum ?No other concerns. Term labor symptoms and general obstetric precautions including but not limited to vaginal bleeding, contractions, leaking of fluid and fetal movement were reviewed in detail with the patient. ?Please refer to After Visit Summary for other counseling recommendations.  ? ?Return for Postpartum check. ? ?Future Appointments  ?Date Time Provider [place]  ?08/13/2021 12:15 PM Arville Care, NP PP-PIEDPED PP  ?08/16/2021  9:15 AM WMC-WOCA NST WMC-CWH WMC  ?08/19/2021  6:30 AM MC-LD SCHED ROOM MC-INDC None  ?09/23/2021  1:30 PM Truett Mainland, DO C[facility]-WM[facility] None  ? ? ?Verita Schneiders, MD ? ?

## 2021-08-13 ENCOUNTER — Ambulatory Visit (INDEPENDENT_AMBULATORY_CARE_PROVIDER_SITE_OTHER): Payer: Self-pay | Admitting: Pediatrics

## 2021-08-13 DIAGNOSIS — Z7681 Expectant parent(s) prebirth pediatrician visit: Secondary | ICD-10-CM | POA: Insufficient documentation

## 2021-08-13 NOTE — Progress Notes (Signed)
Prenatal counseling for impending newborn done-- Z76.81  

## 2021-08-16 ENCOUNTER — Ambulatory Visit (INDEPENDENT_AMBULATORY_CARE_PROVIDER_SITE_OTHER): Payer: 59

## 2021-08-16 ENCOUNTER — Telehealth (HOSPITAL_COMMUNITY): Payer: Self-pay | Admitting: *Deleted

## 2021-08-16 ENCOUNTER — Other Ambulatory Visit: Payer: Self-pay | Admitting: Family Medicine

## 2021-08-16 ENCOUNTER — Ambulatory Visit: Payer: Medicaid Other | Admitting: *Deleted

## 2021-08-16 VITALS — BP 133/85 | HR 99

## 2021-08-16 DIAGNOSIS — O24415 Gestational diabetes mellitus in pregnancy, controlled by oral hypoglycemic drugs: Secondary | ICD-10-CM

## 2021-08-16 DIAGNOSIS — O99213 Obesity complicating pregnancy, third trimester: Secondary | ICD-10-CM

## 2021-08-16 DIAGNOSIS — O10919 Unspecified pre-existing hypertension complicating pregnancy, unspecified trimester: Secondary | ICD-10-CM | POA: Diagnosis not present

## 2021-08-16 NOTE — Telephone Encounter (Signed)
Preadmission screen  

## 2021-08-16 NOTE — Progress Notes (Signed)
Pt reports decreased FM x2 days. She felt good FM during NST and BPP.  IOL scheduled 4/20. ? ?Pt informed that the ultrasound is considered a limited OB ultrasound and is not intended to be a complete ultrasound exam.  Patient also informed that the ultrasound is not being completed with the intent of assessing for fetal or placental anomalies or any pelvic abnormalities.  Explained that the purpose of today?s ultrasound is to assess for presentation, BPP and amniotic fluid volume.  Patient acknowledges the purpose of the exam and the limitations of the study.   ? ?

## 2021-08-19 ENCOUNTER — Inpatient Hospital Stay (HOSPITAL_COMMUNITY): Payer: 59

## 2021-08-19 ENCOUNTER — Other Ambulatory Visit: Payer: Self-pay

## 2021-08-19 ENCOUNTER — Encounter (HOSPITAL_COMMUNITY): Payer: Self-pay | Admitting: Family Medicine

## 2021-08-19 ENCOUNTER — Inpatient Hospital Stay (HOSPITAL_COMMUNITY)
Admission: AD | Admit: 2021-08-19 | Discharge: 2021-08-24 | DRG: 786 | Disposition: A | Discharge status: Home / Self Care | Payer: 59 | Attending: Obstetrics and Gynecology | Admitting: Obstetrics and Gynecology

## 2021-08-19 DIAGNOSIS — O24425 Gestational diabetes mellitus in childbirth, controlled by oral hypoglycemic drugs: Secondary | ICD-10-CM | POA: Diagnosis present

## 2021-08-19 DIAGNOSIS — R8271 Bacteriuria: Secondary | ICD-10-CM | POA: Diagnosis present

## 2021-08-19 DIAGNOSIS — Z3490 Encounter for supervision of normal pregnancy, unspecified, unspecified trimester: Secondary | ICD-10-CM | POA: Diagnosis present

## 2021-08-19 DIAGNOSIS — O9081 Anemia of the puerperium: Secondary | ICD-10-CM | POA: Diagnosis not present

## 2021-08-19 DIAGNOSIS — O1002 Pre-existing essential hypertension complicating childbirth: Secondary | ICD-10-CM | POA: Diagnosis present

## 2021-08-19 DIAGNOSIS — R87612 Low grade squamous intraepithelial lesion on cytologic smear of cervix (LGSIL): Secondary | ICD-10-CM | POA: Diagnosis present

## 2021-08-19 DIAGNOSIS — D62 Acute posthemorrhagic anemia: Secondary | ICD-10-CM | POA: Diagnosis not present

## 2021-08-19 DIAGNOSIS — O119 Pre-existing hypertension with pre-eclampsia, unspecified trimester: Secondary | ICD-10-CM | POA: Diagnosis present

## 2021-08-19 DIAGNOSIS — O10919 Unspecified pre-existing hypertension complicating pregnancy, unspecified trimester: Secondary | ICD-10-CM | POA: Diagnosis present

## 2021-08-19 DIAGNOSIS — O99820 Streptococcus B carrier state complicating pregnancy: Secondary | ICD-10-CM | POA: Diagnosis not present

## 2021-08-19 DIAGNOSIS — Z3A39 39 weeks gestation of pregnancy: Secondary | ICD-10-CM

## 2021-08-19 DIAGNOSIS — O1414 Severe pre-eclampsia complicating childbirth: Secondary | ICD-10-CM | POA: Diagnosis not present

## 2021-08-19 DIAGNOSIS — O99824 Streptococcus B carrier state complicating childbirth: Secondary | ICD-10-CM | POA: Diagnosis present

## 2021-08-19 DIAGNOSIS — O114 Pre-existing hypertension with pre-eclampsia, complicating childbirth: Principal | ICD-10-CM | POA: Diagnosis present

## 2021-08-19 DIAGNOSIS — O9952 Diseases of the respiratory system complicating childbirth: Secondary | ICD-10-CM | POA: Diagnosis present

## 2021-08-19 DIAGNOSIS — O24419 Gestational diabetes mellitus in pregnancy, unspecified control: Secondary | ICD-10-CM | POA: Diagnosis present

## 2021-08-19 DIAGNOSIS — J45909 Unspecified asthma, uncomplicated: Secondary | ICD-10-CM | POA: Diagnosis present

## 2021-08-19 DIAGNOSIS — R34 Anuria and oliguria: Secondary | ICD-10-CM

## 2021-08-19 DIAGNOSIS — O411230 Chorioamnionitis, third trimester, not applicable or unspecified: Secondary | ICD-10-CM | POA: Diagnosis present

## 2021-08-19 DIAGNOSIS — O0990 Supervision of high risk pregnancy, unspecified, unspecified trimester: Secondary | ICD-10-CM

## 2021-08-19 DIAGNOSIS — O24424 Gestational diabetes mellitus in childbirth, insulin controlled: Secondary | ICD-10-CM | POA: Diagnosis not present

## 2021-08-19 LAB — COMPREHENSIVE METABOLIC PANEL
ALT: 25 U/L (ref 0–44)
ALT: 15 U/L (ref 0–44)
AST: 74 U/L — ABNORMAL HIGH (ref 15–41)
AST: 20 U/L (ref 15–41)
Albumin: 2.6 g/dL — ABNORMAL LOW (ref 3.5–5.0)
Albumin: 2.5 g/dL — ABNORMAL LOW (ref 3.5–5.0)
Alkaline Phosphatase: 183 U/L — ABNORMAL HIGH (ref 38–126)
Alkaline Phosphatase: 185 U/L — ABNORMAL HIGH (ref 38–126)
Anion gap: 8 (ref 5–15)
Anion gap: 8 (ref 5–15)
BUN: 7 mg/dL (ref 6–20)
BUN: 5 mg/dL — ABNORMAL LOW (ref 6–20)
CO2: 17 mmol/L — ABNORMAL LOW (ref 22–32)
CO2: 21 mmol/L — ABNORMAL LOW (ref 22–32)
Calcium: 9.5 mg/dL (ref 8.9–10.3)
Calcium: 9.1 mg/dL (ref 8.9–10.3)
Chloride: 110 mmol/L (ref 98–111)
Chloride: 107 mmol/L (ref 98–111)
Creatinine, Ser: 0.67 mg/dL (ref 0.44–1.00)
Creatinine, Ser: 0.78 mg/dL (ref 0.44–1.00)
GFR, Estimated: >60 mL/min (ref >60)
GFR, Estimated: >60 mL/min (ref >60)
Glucose, Bld: 74 mg/dL (ref 70–99)
Glucose, Bld: 95 mg/dL (ref 70–99)
Potassium: 5.7 mmol/L — ABNORMAL HIGH (ref 3.5–5.1)
Potassium: 4.2 mmol/L (ref 3.5–5.1)
Sodium: 135 mmol/L (ref 135–145)
Sodium: 136 mmol/L (ref 135–145)
Total Bilirubin: 1.3 mg/dL — ABNORMAL HIGH (ref 0.3–1.2)
Total Bilirubin: 0.6 mg/dL (ref 0.3–1.2)
Total Protein: 5.7 g/dL — ABNORMAL LOW (ref 6.5–8.1)
Total Protein: 6.1 g/dL — ABNORMAL LOW (ref 6.5–8.1)

## 2021-08-19 LAB — TYPE AND SCREEN
ABO/RH(D): O POS
Antibody Screen: NEGATIVE

## 2021-08-19 LAB — GLUCOSE, CAPILLARY
Glucose-Capillary: 91 mg/dL (ref 70–99)
Glucose-Capillary: 92 mg/dL (ref 70–99)
Glucose-Capillary: 106 mg/dL — ABNORMAL HIGH (ref 70–99)

## 2021-08-19 LAB — CBC
HCT: 34.1 % — ABNORMAL LOW (ref 36.0–46.0)
Hemoglobin: 11.3 g/dL — ABNORMAL LOW (ref 12.0–15.0)
MCH: 30.8 pg (ref 26.0–34.0)
MCHC: 33.1 g/dL (ref 30.0–36.0)
MCV: 92.9 fL (ref 80.0–100.0)
Platelets: 250 10*3/uL (ref 150–400)
RBC: 3.67 MIL/uL — ABNORMAL LOW (ref 3.87–5.11)
RDW: 13.1 % (ref 11.5–15.5)
WBC: 13.8 10*3/uL — ABNORMAL HIGH (ref 4.0–10.5)
nRBC: 0 % (ref 0.0–0.2)

## 2021-08-19 LAB — PROTEIN / CREATININE RATIO, URINE
Creatinine, Urine: 166.79 mg/dL
Protein Creatinine Ratio: 0.2 mg/mg{Cre} — ABNORMAL HIGH (ref 0.00–0.15)
Total Protein, Urine: 33 mg/dL

## 2021-08-19 MED ORDER — OXYCODONE-ACETAMINOPHEN 5-325 MG PO TABS: 1.0000 | ORAL_TABLET | ORAL | Status: DC | PRN

## 2021-08-19 MED ORDER — PENICILLIN G POT IN DEXTROSE 60000 UNIT/ML IV SOLN
3.0000 10*6.[IU] | INTRAVENOUS | Status: DC
  Administered 2021-08-19 – 2021-08-20 (×8): 3 10*6.[IU] via INTRAVENOUS
  Filled 2021-08-19 (×8): qty 50

## 2021-08-19 MED ORDER — MISOPROSTOL 50MCG HALF TABLET
50.0000 ug | ORAL_TABLET | ORAL | Status: DC | PRN
  Administered 2021-08-19: 50 ug via BUCCAL
  Filled 2021-08-19: qty 1

## 2021-08-19 MED ORDER — LABETALOL HCL 200 MG PO TABS
600.0000 mg | ORAL_TABLET | Freq: Three times a day (TID) | ORAL | Status: DC
Start: 2021-08-19 — End: 2021-08-21
  Administered 2021-08-19 – 2021-08-20 (×4): 600 mg via ORAL
  Filled 2021-08-19 (×4): qty 3

## 2021-08-19 MED ORDER — DIPHENHYDRAMINE HCL 50 MG/ML IJ SOLN: 12.5000 mg | INTRAMUSCULAR | Status: DC | PRN

## 2021-08-19 MED ORDER — LABETALOL HCL 200 MG PO TABS
400.0000 mg | ORAL_TABLET | Freq: Three times a day (TID) | ORAL | Status: DC
Start: 2021-08-19 — End: 2021-08-19
  Administered 2021-08-19: 400 mg via ORAL
  Filled 2021-08-19: qty 2

## 2021-08-19 MED ORDER — FENTANYL CITRATE (PF) 100 MCG/2ML IJ SOLN
50.0000 ug | INTRAMUSCULAR | Status: DC | PRN
  Administered 2021-08-19: 50 ug via INTRAVENOUS
  Filled 2021-08-19: qty 2

## 2021-08-19 MED ORDER — OXYTOCIN BOLUS FROM INFUSION: 333.0000 mL | Freq: Once | INTRAVENOUS | Status: DC

## 2021-08-19 MED ORDER — ACETAMINOPHEN 325 MG PO TABS: 650.0000 mg | ORAL_TABLET | ORAL | Status: DC | PRN

## 2021-08-19 MED ORDER — LACTATED RINGERS IV SOLN: INTRAVENOUS | Status: DC

## 2021-08-19 MED ORDER — EPHEDRINE 5 MG/ML INJ: 10.0000 mg | INTRAVENOUS | Status: DC | PRN

## 2021-08-19 MED ORDER — SODIUM CHLORIDE 0.9 % IV SOLN
5.0000 10*6.[IU] | Freq: Once | INTRAVENOUS | Status: AC
  Administered 2021-08-19: 5 10*6.[IU] via INTRAVENOUS
  Filled 2021-08-19: qty 5

## 2021-08-19 MED ORDER — ONDANSETRON HCL 4 MG/2ML IJ SOLN: 4.0000 mg | Freq: Four times a day (QID) | INTRAMUSCULAR | Status: DC | PRN

## 2021-08-19 MED ORDER — MISOPROSTOL 50MCG HALF TABLET: 50.0000 ug | ORAL_TABLET | ORAL | Status: DC

## 2021-08-19 MED ORDER — SOD CITRATE-CITRIC ACID 500-334 MG/5ML PO SOLN
30.0000 mL | ORAL | Status: DC | PRN
  Administered 2021-08-21: 30 mL via ORAL
  Filled 2021-08-19: qty 30

## 2021-08-19 MED ORDER — TERBUTALINE SULFATE 1 MG/ML IJ SOLN: 0.2500 mg | Freq: Once | INTRAMUSCULAR | Status: DC | PRN

## 2021-08-19 MED ORDER — PHENYLEPHRINE 80 MCG/ML (10ML) SYRINGE FOR IV PUSH (FOR BLOOD PRESSURE SUPPORT): 80.0000 ug | PREFILLED_SYRINGE | INTRAVENOUS | Status: DC | PRN

## 2021-08-19 MED ORDER — MISOPROSTOL 25 MCG QUARTER TABLET
25.0000 ug | ORAL_TABLET | ORAL | Status: DC | PRN
  Administered 2021-08-19: 25 ug via VAGINAL
  Filled 2021-08-19: qty 1

## 2021-08-19 MED ORDER — LACTATED RINGERS IV SOLN
500.0000 mL | Freq: Once | INTRAVENOUS | Status: AC
  Administered 2021-08-20: 500 mL via INTRAVENOUS

## 2021-08-19 MED ORDER — FENTANYL CITRATE (PF) 100 MCG/2ML IJ SOLN
100.0000 ug | INTRAMUSCULAR | Status: DC | PRN
  Administered 2021-08-19 – 2021-08-20 (×4): 100 ug via INTRAVENOUS
  Filled 2021-08-19 (×6): qty 2

## 2021-08-19 MED ORDER — LACTATED RINGERS IV SOLN
500.0000 mL | INTRAVENOUS | Status: DC | PRN
  Administered 2021-08-20 (×2): 500 mL via INTRAVENOUS
  Administered 2021-08-20: 300 mL via INTRAVENOUS
  Administered 2021-08-20 – 2021-08-21 (×2): 500 mL via INTRAVENOUS

## 2021-08-19 MED ORDER — OXYCODONE-ACETAMINOPHEN 5-325 MG PO TABS: 2.0000 | ORAL_TABLET | ORAL | Status: DC | PRN

## 2021-08-19 MED ORDER — FENTANYL-BUPIVACAINE-NACL 0.5-0.125-0.9 MG/250ML-% EP SOLN
12.0000 mL/h | EPIDURAL | Status: DC | PRN
  Filled 2021-08-19 (×2): qty 250

## 2021-08-19 MED ORDER — LABETALOL HCL 200 MG PO TABS: 600.0000 mg | ORAL_TABLET | Freq: Three times a day (TID) | ORAL | Status: DC

## 2021-08-19 MED ORDER — LIDOCAINE HCL (PF) 1 % IJ SOLN: 30.0000 mL | INTRAMUSCULAR | Status: DC | PRN

## 2021-08-19 MED ORDER — OXYTOCIN-SODIUM CHLORIDE 30-0.9 UT/500ML-% IV SOLN: 2.5000 [IU]/h | INTRAVENOUS | Status: DC

## 2021-08-19 NOTE — Progress Notes (Signed)
Labor Progress Note ?[name] is a 26 y.o. G1P0000 at [redacted]w[redacted]d presented for IOL 2/2 to [facility] and A2GDM ? ?S: Patient is resting comfortably. ? ?O:  ?BP 136/81   Pulse 96   Temp 98.7 ?F (37.1 ?C) (Oral)   Resp 18   Ht 5\' 5"  (1.651 m)   Wt 101.1 kg   LMP 11/19/2020 (Exact Date)   BMI 37.09 kg/m?  ?EFM: 130 bpm/moderate variability/+acceleration  ? ?CVE: Dilation: Closed ?Effacement (%): Thick ?Station: -3 ?Presentation: Vertex ?Exam by:: Dr. Dione Plover ? ? ?[name]: 26 y.o. G1P0000 [redacted]w[redacted]d  ?#Labor: Progressing well. Cervix is closed and thick. Will give a second buccal Cytotec and continue induction management with plan to reassess in 4 hours.  ?#Pain: IV PRN and epidural at pt request ?#FWB: Cat 1 ?#GBS positive on PCN ?#A2GDM: continue routine CBG check and monitor diet  ?#CHTN: on Labetalol. Pt denies any headache, vision changes, SOB or RUQ abdominal pain. ? ?Alen Bleacher, MD ?Center for [facility], [place] ?2:43 PM  ?

## 2021-08-19 NOTE — Progress Notes (Addendum)
CBG 141, MD notified. No new orders. Will continue to assess. ? ?

## 2021-08-19 NOTE — Progress Notes (Addendum)
Labor Progress Note ?Tara Chase is a 26 y.o. G1P0000 at [redacted]w[redacted]d presented for IOL 2/2 CHTN and A2GDM ? ?S: Patient is resting comfortably. Denies any headache, vision changes, RUQ pain or SOB. Patient endorses more frequent contractions and pain of 4/10. ? ?O:  ?BP (!) 155/98   Pulse 89   Temp 98.3 ?F (36.8 ?C) (Oral)   Resp 18   Ht 5\' 5"  (1.651 m)   Wt 101.1 kg   LMP 11/19/2020 (Exact Date)   BMI 37.09 kg/m?  ?EFM: 120 bmp/ moderate variability/+acceleration ? ?CVE: Dilation: Closed ?Effacement (%): Thick ?Station: -3 ?Presentation: Vertex ?Exam by:: Dr. [email or web address] ? ? ?[name]: 26 y.o. G1P0000 [redacted]w[redacted]d  ?#Labor: Cooks catheter inserted, by FCD,  inflated w/80cc H20 gradually ?#Pain: reports 4/10 pain. Will give IV fentanyl  ?#FWB: Cat 1 ?#GBS positive on PCN ?#CHTN: with increasing BP. Last read was 155/98 but asymptomatic. Will increase labetalol to 600 mg TID and monitor closely for severe symptoms.  ?#A2GDM: Continue routine CBG check  ? ?Dr. [redacted]w[redacted]d given report ? ?[name], MD ?Center for [facility], [facility] ?7:38 PM  ? ?[facility] [place] ? ?

## 2021-08-19 NOTE — H&P (Signed)
OBSTETRIC ADMISSION HISTORY AND PHYSICAL ? ?Tara Chase is a 26 y.o. female G1P0000 with IUP at 72w0dby LMP presenting for IOL 2/2 to [place]. She reports +FMs, No LOF, no VB, no blurry vision, headaches or peripheral edema, and RUQ pain.  She plans on bottle feeding. She's undecided for birth control. ?[facility] ? ?Dating: By LMP --->  Estimated Date of Delivery: 08/26/21 ? ?Sono:   ? ?@[redacted]w[redacted]d , CWD, normal anatomy, Cephalic presentation,  29983J 19% EFW ? ? ?Prenatal History/Complications:  ?-Hx of CHTN on labetalol ?-A2GDM on metformin  ?-LR NIPS ?-GBS positive ? ? ?Past Medical History: ?Past Medical History:  ?Diagnosis Date  ? Asthma   ? Gestational diabetes   ? Hypertension   ? ? ?Past Surgical History: ?Past Surgical History:  ?Procedure Laterality Date  ? NO PAST SURGERIES    ? ? ?Obstetrical History: ?OB History   ? ? Gravida  ?1  ? Para  ?0  ? Term  ?0  ? Preterm  ?0  ? AB  ?0  ? Living  ?0  ?  ? ? SAB  ?0  ? IAB  ?0  ? Ectopic  ?0  ? Multiple  ?0  ? Live Births  ?0  ?   ?  ?  ? ? ?Social History ?Social History  ? ?Socioeconomic History  ? Marital status: Single  ?  Spouse name: Not on file  ? Number of children: Not on file  ? Years of education: Not on file  ? Highest education level: Not on file  ?Occupational History  ? Not on file  ?Tobacco Use  ? Smoking status: Never  ? Smokeless tobacco: Never  ?Vaping Use  ? Vaping Use: Never used  ?Substance and Sexual Activity  ? Alcohol use: Not Currently  ? Drug use: No  ? Sexual activity: Yes  ?  Partners: Male  ?  Birth control/protection: None, Condom  ?  Comment: 1st intercourse 89yo-5 partners  ?Other Topics Concern  ? Not on file  ?Social History Narrative  ? Not on file  ? ?Social Determinants of Health  ? ?Financial Resource Strain: Not on file  ?Food Insecurity: Not on file  ?Transportation Needs: Not on file  ?Physical Activity: Not on file  ?Stress: Not on file  ?Social Connections: Not on file  ? ? ?Family  History: ?Family History  ?Problem Relation Age of Onset  ? Hypertension Mother   ? Hypertension Father   ? Stroke Father   ? Cancer Maternal Grandfather   ?     prostate  ? Cancer Paternal Grandfather   ?     colon  ? ? ?Allergies: ?Allergies  ?Allergen Reactions  ? Pollen Extract Other (See Comments)  ?  Seasonal allergies  ? ? ?Pt denies allergies to latex, iodine, or shellfish. ? ?Medications Prior to Admission  ?Medication Sig Dispense Refill Last Dose  ? aspirin EC 81 MG tablet Take 81 mg by mouth daily. Swallow whole.   08/19/2021  ? labetalol (NORMODYNE) 200 MG tablet Take 2 tablets (400 mg total) by mouth 3 (three) times daily. 180 tablet 2 08/19/2021  ? metFORMIN (GLUCOPHAGE) 500 MG tablet Take 1 tablet (500 mg total) by mouth daily with breakfast. 30 tablet 3 Past Week  ? Prenatal Vit-Fe Fumarate-FA (PRENATAL VITAMINS PO) Take by mouth.   08/19/2021  ? Accu-Chek Softclix Lancets lancets DX: O24.419. Check BS QID. 100 each 12   ?  albuterol (PROVENTIL HFA;VENTOLIN HFA) 108 (90 Base) MCG/ACT inhaler Inhale 1-2 puffs into the lungs every 6 (six) hours as needed for wheezing or shortness of breath.   More than a month  ? Blood Glucose Monitoring Suppl (ACCU-CHEK GUIDE) w/Device KIT DX: O24.419. Check BS QID. 1 kit 0   ? cyclobenzaprine (FLEXERIL) 10 MG tablet Take 1 tablet (10 mg total) by mouth 2 (two) times daily as needed for muscle spasms. 20 tablet 0   ? glucose blood test strip DX: O24.419. Check BS QID. 100 each 12   ? ? ? ?Review of Systems  ? ?All systems reviewed and negative except as stated in HPI ? ?Last menstrual period 11/19/2020. ?General appearance: alert, cooperative, and appears stated age ?Lungs: clear to auscultation bilaterally ?Heart: regular rate and rhythm ?Abdomen: soft, non-tender; bowel sounds normal ?Extremities: Homans sign is negative, no sign of DVT ?Presentation: cephalic ?Fetal monitoringBaseline: 140 bpm, Variability: Good {> 6 bpm), and Accelerations: Reactive ?Uterine  activityNone ?  ? ? ?Prenatal labs: ?ABO, Rh: --/--/O POS (09/03 1842) ?Antibody:   ?Rubella:   ?RPR: Non Reactive (01/20 0913)  ?HBsAg: Negative (01/20 0911)  ?HIV: Non Reactive (01/20 0913)  ?GBS: Positive/-- (03/30 0000)  ?1 hr Glucola: Normal ?Genetic screening:  Normal ?Anatomy US: Normal ? ?Prenatal Transfer Tool  ?Maternal Diabetes: Yes:  Diabetes Type:  Insulin/Medication controlled ?Genetic Screening: Normal ?Maternal Ultrasounds/Referrals: Normal ?Fetal Ultrasounds or other Referrals:  None ?Maternal Substance Abuse:  No ?Significant Maternal Medications:  None ?Significant Maternal Lab Results: Group B Strep positive ? ?No results found for this or any previous visit (from the past 24 hour(s)). ? ?Patient Active Problem List  ? Diagnosis Date Noted  ? Encounter for induction of labor 08/19/2021  ? Pediatric pre-birth visit for expectant parent 08/13/2021  ? Gestational diabetes 04/08/2021  ? Chronic hypertension affecting pregnancy 04/07/2021  ? GBS bacteriuria 03/16/2021  ? Supervision of high risk pregnancy, antepartum 03/10/2021  ? Low grade squamous intraepithelial lesion (LGSIL) on cervical Pap smear 02/02/2016  ? ? ?Assessment/Plan:  ?Tara Chase is a 26 y.o. G1P0000 at 68w0dhere for IOL 2/2 CHTN and A2GDM ? ?#Labor:Patient arrived 0.5 cm dilated and thick. Discussed use of Cytotec, FB and pitocin for induction which patient verbalized understanding. Received first buccal Cytotec with plan to reassess in 4 hours. Will consider FB at the time depending on progress. ?#Pain: IV PRN and Epidural at patient request  ?#FWB: Cat 1 ?#ID:  GBS positive will treat with prophylactic PCN  ?#MOF: Bottle ?#MOC: Undecided  ?#Circ:  Yes needs consent  ?#CHTN: will continue home medication of labetalol 4074mTID and monitor BP routinely. ?#A2GDM: Hold metformin and monitor CBG Q4H with increased frequency once pt is in active labor. ? ?JoAlen BleacherMD  ?Center [facility]/20/2023, 10:10 AM ? ?  ?

## 2021-08-20 ENCOUNTER — Encounter: Payer: Medicaid Other | Admitting: Family Medicine

## 2021-08-20 ENCOUNTER — Inpatient Hospital Stay (HOSPITAL_COMMUNITY): Payer: 59 | Admitting: Anesthesiology

## 2021-08-20 LAB — GLUCOSE, CAPILLARY
Glucose-Capillary: 102 mg/dL — ABNORMAL HIGH (ref 70–99)
Glucose-Capillary: 103 mg/dL — ABNORMAL HIGH (ref 70–99)
Glucose-Capillary: 103 mg/dL — ABNORMAL HIGH (ref 70–99)
Glucose-Capillary: 110 mg/dL — ABNORMAL HIGH (ref 70–99)
Glucose-Capillary: 86 mg/dL (ref 70–99)
Glucose-Capillary: 89 mg/dL (ref 70–99)
Glucose-Capillary: 93 mg/dL (ref 70–99)

## 2021-08-20 LAB — CBC
HCT: 37.1 % (ref 36.0–46.0)
HCT: 34.4 % — ABNORMAL LOW (ref 36.0–46.0)
Hemoglobin: 12.5 g/dL (ref 12.0–15.0)
Hemoglobin: 11.2 g/dL — ABNORMAL LOW (ref 12.0–15.0)
MCH: 31.3 pg (ref 26.0–34.0)
MCH: 30.2 pg (ref 26.0–34.0)
MCHC: 33.7 g/dL (ref 30.0–36.0)
MCHC: 32.6 g/dL (ref 30.0–36.0)
MCV: 92.8 fL (ref 80.0–100.0)
MCV: 92.7 fL (ref 80.0–100.0)
Platelets: 255 10*3/uL (ref 150–400)
Platelets: 236 10*3/uL (ref 150–400)
RBC: 4 MIL/uL (ref 3.87–5.11)
RBC: 3.71 MIL/uL — ABNORMAL LOW (ref 3.87–5.11)
RDW: 13.1 % (ref 11.5–15.5)
RDW: 13.2 % (ref 11.5–15.5)
WBC: 19.7 10*3/uL — ABNORMAL HIGH (ref 4.0–10.5)
WBC: 19.1 10*3/uL — ABNORMAL HIGH (ref 4.0–10.5)
nRBC: 0 % (ref 0.0–0.2)
nRBC: 0 % (ref 0.0–0.2)

## 2021-08-20 LAB — COMPREHENSIVE METABOLIC PANEL
ALT: 17 U/L (ref 0–44)
AST: 23 U/L (ref 15–41)
Albumin: 2.5 g/dL — ABNORMAL LOW (ref 3.5–5.0)
Alkaline Phosphatase: 198 U/L — ABNORMAL HIGH (ref 38–126)
Anion gap: 7 (ref 5–15)
BUN: 8 mg/dL (ref 6–20)
CO2: 21 mmol/L — ABNORMAL LOW (ref 22–32)
Calcium: 9.1 mg/dL (ref 8.9–10.3)
Chloride: 104 mmol/L (ref 98–111)
Creatinine, Ser: 1.28 mg/dL — ABNORMAL HIGH (ref 0.44–1.00)
GFR, Estimated: 59 mL/min — ABNORMAL LOW (ref >60)
Glucose, Bld: 95 mg/dL (ref 70–99)
Potassium: 3.9 mmol/L (ref 3.5–5.1)
Sodium: 132 mmol/L — ABNORMAL LOW (ref 135–145)
Total Bilirubin: 0.7 mg/dL (ref 0.3–1.2)
Total Protein: 6.1 g/dL — ABNORMAL LOW (ref 6.5–8.1)

## 2021-08-20 LAB — RPR: RPR Ser Ql: NONREACTIVE

## 2021-08-20 MED ORDER — SODIUM CHLORIDE 0.9 % IV SOLN
2.0000 g | Freq: Four times a day (QID) | INTRAVENOUS | Status: DC
  Administered 2021-08-20 – 2021-08-21 (×2): 2 g via INTRAVENOUS
  Filled 2021-08-20 (×2): qty 2000

## 2021-08-20 MED ORDER — TERBUTALINE SULFATE 1 MG/ML IJ SOLN: 0.2500 mg | Freq: Once | INTRAMUSCULAR | Status: DC | PRN

## 2021-08-20 MED ORDER — FENTANYL-BUPIVACAINE-NACL 0.5-0.125-0.9 MG/250ML-% EP SOLN
EPIDURAL | Status: DC | PRN
  Administered 2021-08-20: 12 mL/h via EPIDURAL
  Administered 2021-08-20: 500 ug via EPIDURAL

## 2021-08-20 MED ORDER — LACTATED RINGERS IV BOLUS: 500.0000 mL | Freq: Once | INTRAVENOUS | Status: DC

## 2021-08-20 MED ORDER — LIDOCAINE HCL (PF) 1 % IJ SOLN
INTRAMUSCULAR | Status: DC | PRN
  Administered 2021-08-20: 2 mL via EPIDURAL
  Administered 2021-08-20: 10 mL via EPIDURAL

## 2021-08-20 MED ORDER — BUPIVACAINE HCL (PF) 0.25 % IJ SOLN
INTRAMUSCULAR | Status: DC | PRN
  Administered 2021-08-20 – 2021-08-21 (×2): 8 mL via EPIDURAL

## 2021-08-20 MED ORDER — GENTAMICIN SULFATE 40 MG/ML IJ SOLN
5.0000 mg/kg | INTRAVENOUS | Status: DC
  Administered 2021-08-20: 370 mg via INTRAVENOUS
  Filled 2021-08-20: qty 9.25

## 2021-08-20 MED ORDER — OXYTOCIN-SODIUM CHLORIDE 30-0.9 UT/500ML-% IV SOLN
1.0000 m[IU]/min | INTRAVENOUS | Status: DC
  Administered 2021-08-20: 2 m[IU]/min via INTRAVENOUS
  Filled 2021-08-20: qty 500

## 2021-08-20 MED ORDER — ACETAMINOPHEN 500 MG PO TABS
1000.0000 mg | ORAL_TABLET | Freq: Once | ORAL | Status: AC
  Administered 2021-08-20: 1000 mg via ORAL
  Filled 2021-08-20: qty 2

## 2021-08-20 MED ORDER — FENTANYL CITRATE (PF) 100 MCG/2ML IJ SOLN
INTRAMUSCULAR | Status: DC | PRN
  Administered 2021-08-20 – 2021-08-21 (×2): 100 ug via EPIDURAL

## 2021-08-20 NOTE — Progress Notes (Signed)
Tara Chase is a 26 y.o. G1P0000 at 102w1d admitted for induction of labor due to [facility] and GDM. ? ?Subjective: ?Pt comfortable with epidural.  Family in room for support. ? ?Objective: ?BP 135/84   Pulse 98   Temp 98.3 ?F (36.8 ?C) (Oral)   Resp 16   Ht 5\' 5"  (1.651 m)   Wt 101.1 kg   LMP 11/19/2020 (Exact Date)   SpO2 98%   BMI 37.09 kg/m?  ?No intake/output data recorded. ?Total I/O ?In: -  ?Out: 800 [Urine:800] ? ?FHT:  FHR: 120 bpm, variability: moderate,  accelerations:  Present,  decelerations:  Absent ?UC:   regular, every 2 minutes, MVU 180 ?SVE:   Dilation: 5.5 ?Effacement (%): 70 ?Station: -3 ?Exam by:: Fatima Blank, CNM ? ?Labs: ?Lab Results  ?Component Value Date  ? WBC 19.7 (H) 08/20/2021  ? HGB 12.5 08/20/2021  ? HCT 37.1 08/20/2021  ? MCV 92.8 08/20/2021  ? PLT 255 08/20/2021  ? ? ?Assessment / Plan: ?Induction of labor due to [facility] and GDM,  progressing well on pitocin ? ?Labor:  Slow progress, but descent from -3 station since last exam.  Maternal positioning to exaggerated right lateral sims. Will continue to titrate pitocin and change maternal position. Recheck in 3-4 hours. ?Preeclampsia:  labs stable ?Fetal Wellbeing:  Category I ?Pain Control:  Epidural ?I/D:   GBS pos on PCN ?Anticipated MOD:  NSVD ? ?Tara Chase ?08/20/2021, 4:33 PM ? ? ?

## 2021-08-20 NOTE — Progress Notes (Signed)
Patient Vitals for the past 4 hrs: ? BP Temp Temp src Pulse  ?08/20/21 0056 (!) 155/86 -- -- 86  ?08/20/21 0007 140/76 -- -- 95  ?08/19/21 2323 (!) 151/90 98.5 ?F (36.9 ?C) Oral 94  ?08/19/21 2207 (!) 152/90 -- -- 85  ? ?BS 103/106. Cooks still in. Ctx q 1-3 minutes. FHR Cat 1. Continue present mgt.  ?

## 2021-08-20 NOTE — Progress Notes (Signed)
Tara Chase is a 26 y.o. G1P0000 at [redacted]w[redacted]d by ultrasound admitted for induction of labor due to [facility] and GDM. ? ?Subjective: ?Pt comfortable with epidural. Family in room for support. ? ?Objective: ?BP 136/87   Pulse 93   Temp 98.5 ?F (36.9 ?C) (Oral)   Resp 16   Ht 5\' 5"  (1.651 m)   Wt 101.1 kg   LMP 11/19/2020 (Exact Date)   SpO2 98%   BMI 37.09 kg/m?  ?No intake/output data recorded. ?Total I/O ?In: -  ?Out: 800 [Urine:800] ? ?FHT:  FHR: 135 bpm, variability: moderate,  accelerations:  Present,  decelerations:  Absent ?UC:   irregular, every 2-5 minutes ?SVE:   Dilation: 5.5 ?Effacement (%): 70 ?Station: -3 ?Exam by:: [email or web address], CNM ?AROM with clear fluid, IUPC placed without difficulty. Pt tolerated well. ? ?Labs: ?Lab Results  ?Component Value Date  ? WBC 19.7 (H) 08/20/2021  ? HGB 12.5 08/20/2021  ? HCT 37.1 08/20/2021  ? MCV 92.8 08/20/2021  ? PLT 255 08/20/2021  ? ? ? ?Assessment / Plan: ?Induction of labor due to [facility] and GDM,  progressing well on pitocin ? ?Labor: Progressing normally ?Preeclampsia:  labs stable ?Fetal Wellbeing:  Category I ?Pain Control:  Epidural ?I/D:   GBS pos on PCN ?Anticipated MOD:  NSVD ? ?[facility] [place] ?08/20/2021, 12:16 PM ? ? ?

## 2021-08-20 NOTE — Progress Notes (Signed)
Tara Chase is a 26 y.o. G1P0000 at [redacted]w[redacted]d admitted for induction of labor due to [place]. ? ?Delayed documentation ?Subjective: ?Reports she feels lower pelvic pressure/pain.  ? ?Objective: ?BP (!) 152/101   Pulse 99   Temp (!) 100.6 ?F (38.1 ?C) (Axillary)   Resp 16   Ht 5\' 5"  (1.651 m)   Wt 101.1 kg   LMP 11/19/2020 (Exact Date)   SpO2 98%   BMI 37.09 kg/m?  ?I/O last 3 completed shifts: ?In: -  ?Out: 950 [Urine:950] ?No intake/output data recorded. ? ?FHT:  FHR: 130 bpm, variability: moderate,  accelerations:  Present,  decelerations:  Absent ?UC:   regular, every 2-3 minutes ?SVE:   Dilation: 6 ?Effacement (%): 80 ?Station: -2, -3 ?Exam by:: Dr. [email or web address] ? ?Labs: ?Lab Results  ?Component Value Date  ? WBC 19.1 (H) 08/20/2021  ? HGB 11.2 (L) 08/20/2021  ? HCT 34.4 (L) 08/20/2021  ? MCV 92.7 08/20/2021  ? PLT 236 08/20/2021  ? ? ?Assessment / Plan: ?G1P0000 at [redacted]w[redacted]d admitted for induction of labor due to cHTN now on day 2 of IOL, also with decreased UOP, and now with triple I  ? ? ?#cHTN ?#Decreased UOP, uptrending cr ?Patient with cHTN, previously on Labetalol 400mg  TID, uptitrated to 600mg  TID during IOL due to BP in the 150s on day 1 of IOL. BP now fluctuating between 140s-150s/high 90s-low 100s. Patient denies headache or vision changes. Does have decreased UOP over past ~6 hours. Urine also appears bloody. ? Cr uptrended from 0.78 (at admission) to 1.28. Will trial Fluid bolus and running further maintenance fluids and recheck labs at midnight. If creatinine continues to uptrend will presume severe feature (Cr above 1.1) and plan to start Mg.  ? ? ?Labor: patient's cervix ~5 cm (unchanged from last check), IUPC in place and contractions not adequate on Pitocin of 16. Fetal head feels well engaged in cervix although still high at -2 station. Will continue uptitrating pitocin at this time to attempt to get to adequate contractions.   ? ?-Given her current status of possible worsening hypertensive disorder  with concern for end organ involvement we discussed in depth that important to move towards delivery. At this time cervix unchanged since ~4PM but also not adequate contractions yet. Will plan to uptitrate pitocin, treat Triple I, and watch closely for cervical change. If evidence of worsening hypertensive disease and end organ involvement and continues to be remote from delivery at that time will consider cesarean section. Discussed plan with patient who expresses understanding. Also discussed with Dr. [redacted]w[redacted]d who is in agreement. ? ?Fetal Wellbeing:  Category I ?Pain Control:  Epidural ?I/D:   GBS pos, was on PCN. But now with presumed Triple I (temp 100.6) and therefore will start Amp/Gent and also give dose of Tylenol ? ? ? , MD, MPH ?OB Fellow, Faculty Practice ? ?

## 2021-08-20 NOTE — Anesthesia Procedure Notes (Signed)
Epidural ?Patient location during procedure: OB ?Start time: 08/20/2021 4:16 AM ?End time: 08/20/2021 4:26 AM ? ?Staffing ?Anesthesiologist: Lannie Fields, DO ?Performed: anesthesiologist  ? ?Preanesthetic Checklist ?Completed: patient identified, IV checked, risks and benefits discussed, monitors and equipment checked, pre-op evaluation and timeout performed ? ?Epidural ?Patient position: sitting ?Prep: DuraPrep and site prepped and draped ?Patient monitoring: continuous pulse ox, blood pressure, heart rate and cardiac monitor ?Approach: midline ?Location: L3-L4 ?Injection technique: LOR air ? ?Needle:  ?Needle type: Tuohy  ?Needle gauge: 17 G ?Needle length: 9 cm ?Needle insertion depth: 6 cm ?Catheter type: closed end flexible ?Catheter size: 19 Gauge ?Catheter at skin depth: 11 cm ?Test dose: negative ? ?Assessment ?Sensory level: T8 ?Events: blood not aspirated, injection not painful, no injection resistance, no paresthesia and negative IV test ? ?Additional Notes ?Patient identified. Risks/Benefits/Options discussed with patient including but not limited to bleeding, infection, nerve damage, paralysis, failed block, incomplete pain control, headache, blood pressure changes, nausea, vomiting, reactions to medication both or allergic, itching and postpartum back pain. Confirmed with bedside nurse the patient's most recent platelet count. Confirmed with patient that they are not currently taking any anticoagulation, have any bleeding history or any family history of bleeding disorders. Patient expressed understanding and wished to proceed. All questions were answered. Sterile technique was used throughout the entire procedure. Please see nursing notes for vital signs. Test dose was given through epidural catheter and negative prior to continuing to dose epidural or start infusion. Warning signs of high block given to the patient including shortness of breath, tingling/numbness in hands, complete motor  block, or any concerning symptoms with instructions to call for help. Patient was given instructions on fall risk and not to get out of bed. All questions and concerns addressed with instructions to call with any issues or inadequate analgesia.  Reason for block:procedure for pain ? ? ? ?

## 2021-08-20 NOTE — Progress Notes (Signed)
Patient Vitals for the past 4 hrs: ? BP Temp Temp src Pulse Resp SpO2  ?08/20/21 0804 132/83 -- -- 91 -- --  ?08/20/21 0732 138/86 98.3 ?F (36.8 ?C) Oral 90 16 --  ?08/20/21 0700 (!) 146/83 -- -- 95 -- --  ?08/20/21 0630 (!) 153/87 -- -- 91 -- --  ?08/20/21 0600 (!) 145/94 -- -- 89 -- --  ?08/20/21 0530 (!) 135/92 -- -- 88 -- --  ?08/20/21 0505 137/79 -- -- 92 -- --  ?08/20/21 0500 (!) 155/96 -- -- 87 -- --  ?08/20/21 0455 (!) 153/96 -- -- 82 -- --  ?08/20/21 0450 (!) 156/97 -- -- 89 -- --  ?08/20/21 0445 (!) 157/102 -- -- 88 -- --  ?08/20/21 0440 (!) 152/101 -- -- 89 -- --  ?08/20/21 0435 (!) 158/99 -- -- 86 -- --  ?08/20/21 0430 (!) 149/97 -- -- 86 -- --  ?08/20/21 0425 (!) 147/83 -- -- -- -- --  ?08/20/21 0420 -- -- -- -- -- 98 %  ?08/20/21 0416 -- -- -- -- -- 98 %  ? ?Comfortable w/epidural.  Vtx confirmed w/US.  Cx 5.5/70/-3.  Ctx have spaced out, will start pitocin.  ?

## 2021-08-20 NOTE — Progress Notes (Signed)
Pharmacy Antibiotic Note ? ?Tara Chase is a 26 y.o. female admitted on 08/19/2021 for IOL due to [facility] and GDM at [redacted]w[redacted]d.  Pharmacy has been consulted for Gentamicin dosing for Triple I ? ?Plan: ?Gentamicin 5mg /kg (370mg ) IV q24h. ?Will continue to follow ? ?Height: 5\' 5"  (165.1 cm) ?Weight: 101.1 kg (222 lb 14.4 oz) ?IBW/kg (Calculated) : 57 ? ?Temp (24hrs), Avg:98.8 ?F (37.1 ?C), Min:98.1 ?F (36.7 ?C), Max:100.6 ?F (38.1 ?C) ? ?Recent Labs  ?Lab 08/19/21 ?1008 08/19/21 ?1723 08/20/21 ?0321 08/20/21 ?1833  ?WBC 13.8*  --  19.7* 19.1*  ?CREATININE 0.67 0.78  --  1.28*  ?  ?Estimated Creatinine Clearance: 77.7 mL/min (A) (by C-G formula based on SCr of 1.28 mg/dL (H)).   ? ?Allergies  ?Allergen Reactions  ? Pollen Extract Other (See Comments)  ?  Seasonal allergies  ? ? ?Antimicrobials this admission: ?PCN protocol for GBS + 4/20- 4/21 ?Ampicillin 2 gram IV q6h 4/21 >> ? ? ? ?Thank you for allowing pharmacy to be a part of this patient?s care. ? ?Vernie Ammons ?08/20/2021 9:54 PM ? ?

## 2021-08-20 NOTE — Anesthesia Preprocedure Evaluation (Signed)
Anesthesia Evaluation  ?Patient identified by MRN, date of birth, ID band ?Patient awake ? ? ? ?Reviewed: ?Allergy & Precautions, Patient's Chart, lab work & pertinent test results ? ?Airway ?Mallampati: III ? ?TM Distance: >3 FB ?Neck ROM: Full ? ? ? Dental ?no notable dental hx. ? ?  ?Pulmonary ?asthma ,  ?  ?Pulmonary exam normal ?breath sounds clear to auscultation ? ? ? ? ? ? Cardiovascular ?hypertension, Pt. on medications and Pt. on home beta blockers ?Normal cardiovascular exam ?Rhythm:Regular Rate:Normal ? ? ?  ?Neuro/Psych ?negative neurological ROS ? negative psych ROS  ? GI/Hepatic ?negative GI ROS, Neg liver ROS,   ?Endo/Other  ?diabetes, Well Controlled, Gestational, Oral Hypoglycemic AgentsObesity BMI 37 ? Renal/GU ?negative Renal ROS  ?negative genitourinary ?  ?Musculoskeletal ?negative musculoskeletal ROS ?(+)  ? Abdominal ?(+) + obese,   ?Peds ?negative pediatric ROS ?(+)  Hematology ?Hb 12.5, plt 255   ?Anesthesia Other Findings ? ? Reproductive/Obstetrics ?(+) Pregnancy ? ?  ? ? ? ? ? ? ? ? ? ? ? ? ? ?  ?  ? ? ? ? ? ? ? ? ?Anesthesia Physical ?Anesthesia Plan ? ?ASA: 3 ? ?Anesthesia Plan: Epidural  ? ?Post-op Pain Management:   ? ?Induction:  ? ?PONV Risk Score and Plan: 2 ? ?Airway Management Planned: Natural Airway ? ?Additional Equipment: None ? ?Intra-op Plan:  ? ?Post-operative Plan:  ? ?Informed Consent: I have reviewed the patients History and Physical, chart, labs and discussed the procedure including the risks, benefits and alternatives for the proposed anesthesia with the patient or authorized representative who has indicated his/her understanding and acceptance.  ? ? ? ? ? ?Plan Discussed with:  ? ?Anesthesia Plan Comments:   ? ? ? ? ? ? ?Anesthesia Quick Evaluation ? ?

## 2021-08-21 ENCOUNTER — Other Ambulatory Visit: Payer: Self-pay

## 2021-08-21 ENCOUNTER — Encounter (HOSPITAL_COMMUNITY)
Admission: AD | Disposition: A | Discharge status: Home / Self Care | Payer: Self-pay | Source: Home / Self Care | Attending: Obstetrics and Gynecology

## 2021-08-21 ENCOUNTER — Encounter (HOSPITAL_COMMUNITY): Payer: Self-pay | Admitting: Family Medicine

## 2021-08-21 DIAGNOSIS — O24425 Gestational diabetes mellitus in childbirth, controlled by oral hypoglycemic drugs: Secondary | ICD-10-CM

## 2021-08-21 DIAGNOSIS — O1002 Pre-existing essential hypertension complicating childbirth: Secondary | ICD-10-CM

## 2021-08-21 DIAGNOSIS — O99820 Streptococcus B carrier state complicating pregnancy: Secondary | ICD-10-CM

## 2021-08-21 DIAGNOSIS — O24424 Gestational diabetes mellitus in childbirth, insulin controlled: Secondary | ICD-10-CM

## 2021-08-21 DIAGNOSIS — O411230 Chorioamnionitis, third trimester, not applicable or unspecified: Secondary | ICD-10-CM

## 2021-08-21 DIAGNOSIS — O114 Pre-existing hypertension with pre-eclampsia, complicating childbirth: Secondary | ICD-10-CM

## 2021-08-21 DIAGNOSIS — O119 Pre-existing hypertension with pre-eclampsia, unspecified trimester: Secondary | ICD-10-CM | POA: Diagnosis present

## 2021-08-21 DIAGNOSIS — Z3A39 39 weeks gestation of pregnancy: Secondary | ICD-10-CM

## 2021-08-21 DIAGNOSIS — O1414 Severe pre-eclampsia complicating childbirth: Secondary | ICD-10-CM

## 2021-08-21 DIAGNOSIS — R34 Anuria and oliguria: Secondary | ICD-10-CM

## 2021-08-21 LAB — COMPREHENSIVE METABOLIC PANEL
ALT: 16 U/L (ref 0–44)
ALT: 18 U/L (ref 0–44)
AST: 27 U/L (ref 15–41)
AST: 28 U/L (ref 15–41)
Albumin: 2 g/dL — ABNORMAL LOW (ref 3.5–5.0)
Albumin: 2.4 g/dL — ABNORMAL LOW (ref 3.5–5.0)
Alkaline Phosphatase: 183 U/L — ABNORMAL HIGH (ref 38–126)
Alkaline Phosphatase: 200 U/L — ABNORMAL HIGH (ref 38–126)
Anion gap: 10 (ref 5–15)
Anion gap: 10 (ref 5–15)
BUN: 11 mg/dL (ref 6–20)
BUN: 13 mg/dL (ref 6–20)
CO2: 17 mmol/L — ABNORMAL LOW (ref 22–32)
CO2: 19 mmol/L — ABNORMAL LOW (ref 22–32)
Calcium: 8.5 mg/dL — ABNORMAL LOW (ref 8.9–10.3)
Calcium: 9 mg/dL (ref 8.9–10.3)
Chloride: 101 mmol/L (ref 98–111)
Chloride: 103 mmol/L (ref 98–111)
Creatinine, Ser: 1.97 mg/dL — ABNORMAL HIGH (ref 0.44–1.00)
Creatinine, Ser: 2.36 mg/dL — ABNORMAL HIGH (ref 0.44–1.00)
GFR, Estimated: 28 mL/min — ABNORMAL LOW (ref >60)
GFR, Estimated: 35 mL/min — ABNORMAL LOW (ref >60)
Glucose, Bld: 105 mg/dL — ABNORMAL HIGH (ref 70–99)
Glucose, Bld: 111 mg/dL — ABNORMAL HIGH (ref 70–99)
Potassium: 4.2 mmol/L (ref 3.5–5.1)
Potassium: 4.6 mmol/L (ref 3.5–5.1)
Sodium: 130 mmol/L — ABNORMAL LOW (ref 135–145)
Sodium: 130 mmol/L — ABNORMAL LOW (ref 135–145)
Total Bilirubin: 0.5 mg/dL (ref 0.3–1.2)
Total Bilirubin: 0.7 mg/dL (ref 0.3–1.2)
Total Protein: 5.3 g/dL — ABNORMAL LOW (ref 6.5–8.1)
Total Protein: 6 g/dL — ABNORMAL LOW (ref 6.5–8.1)

## 2021-08-21 LAB — CBC
HCT: 32.6 % — ABNORMAL LOW (ref 36.0–46.0)
HCT: 36.6 % (ref 36.0–46.0)
Hemoglobin: 11.2 g/dL — ABNORMAL LOW (ref 12.0–15.0)
Hemoglobin: 12.2 g/dL (ref 12.0–15.0)
MCH: 30.7 pg (ref 26.0–34.0)
MCH: 31.7 pg (ref 26.0–34.0)
MCHC: 33.3 g/dL (ref 30.0–36.0)
MCHC: 34.4 g/dL (ref 30.0–36.0)
MCV: 92.2 fL (ref 80.0–100.0)
MCV: 92.4 fL (ref 80.0–100.0)
Platelets: 231 10*3/uL (ref 150–400)
Platelets: 247 10*3/uL (ref 150–400)
RBC: 3.53 MIL/uL — ABNORMAL LOW (ref 3.87–5.11)
RBC: 3.97 MIL/uL (ref 3.87–5.11)
RDW: 13.3 % (ref 11.5–15.5)
RDW: 13.5 % (ref 11.5–15.5)
WBC: 23.8 10*3/uL — ABNORMAL HIGH (ref 4.0–10.5)
WBC: 27.4 10*3/uL — ABNORMAL HIGH (ref 4.0–10.5)
nRBC: 0 % (ref 0.0–0.2)
nRBC: 0 % (ref 0.0–0.2)

## 2021-08-21 LAB — MAGNESIUM
Magnesium: 3.1 mg/dL — ABNORMAL HIGH (ref 1.7–2.4)
Magnesium: 4.2 mg/dL — ABNORMAL HIGH (ref 1.7–2.4)

## 2021-08-21 LAB — GLUCOSE, CAPILLARY: Glucose-Capillary: 107 mg/dL — ABNORMAL HIGH (ref 70–99)

## 2021-08-21 SURGERY — Surgical Case
Anesthesia: Epidural | Wound class: Clean

## 2021-08-21 MED ORDER — DEXAMETHASONE SODIUM PHOSPHATE 4 MG/ML IJ SOLN
INTRAMUSCULAR | Status: AC
  Filled 2021-08-21: qty 1

## 2021-08-21 MED ORDER — NALOXONE HCL 4 MG/10ML IJ SOLN
1.0000 ug/kg/h | INTRAVENOUS | Status: DC | PRN
  Filled 2021-08-21: qty 5

## 2021-08-21 MED ORDER — ONDANSETRON HCL 4 MG/2ML IJ SOLN
INTRAMUSCULAR | Status: DC | PRN
  Administered 2021-08-21: 4 mg via INTRAVENOUS

## 2021-08-21 MED ORDER — OXYCODONE HCL 5 MG PO TABS: 5.0000 mg | ORAL_TABLET | Freq: Once | ORAL | Status: DC | PRN

## 2021-08-21 MED ORDER — ONDANSETRON HCL 4 MG/2ML IJ SOLN: 4.0000 mg | Freq: Three times a day (TID) | INTRAMUSCULAR | Status: DC | PRN

## 2021-08-21 MED ORDER — KETOROLAC TROMETHAMINE 30 MG/ML IJ SOLN
INTRAMUSCULAR | Status: AC
Start: 2021-08-21 — End: ?
  Filled 2021-08-21: qty 1

## 2021-08-21 MED ORDER — FUROSEMIDE 10 MG/ML IJ SOLN
20.0000 mg | Freq: Once | INTRAMUSCULAR | Status: AC
  Administered 2021-08-21: 20 mg via INTRAVENOUS
  Filled 2021-08-21: qty 2

## 2021-08-21 MED ORDER — OXYCODONE HCL 5 MG/5ML PO SOLN: 5.0000 mg | Freq: Once | ORAL | Status: DC | PRN

## 2021-08-21 MED ORDER — ACETAMINOPHEN 10 MG/ML IV SOLN
1000.0000 mg | Freq: Once | INTRAVENOUS | Status: AC
  Administered 2021-08-21: 1000 mg via INTRAVENOUS

## 2021-08-21 MED ORDER — MENTHOL 3 MG MT LOZG: 1.0000 | LOZENGE | OROMUCOSAL | Status: DC | PRN

## 2021-08-21 MED ORDER — KETOROLAC TROMETHAMINE 30 MG/ML IJ SOLN: 30.0000 mg | Freq: Four times a day (QID) | INTRAMUSCULAR | Status: DC | PRN

## 2021-08-21 MED ORDER — OXYTOCIN-SODIUM CHLORIDE 30-0.9 UT/500ML-% IV SOLN
INTRAVENOUS | Status: DC | PRN
  Administered 2021-08-21 (×2): 30 [IU] via INTRAVENOUS

## 2021-08-21 MED ORDER — SODIUM CHLORIDE 0.9% FLUSH: 3.0000 mL | INTRAVENOUS | Status: DC | PRN

## 2021-08-21 MED ORDER — FUROSEMIDE 10 MG/ML IJ SOLN
INTRAMUSCULAR | Status: DC | PRN
  Administered 2021-08-21: 10 mg via INTRAVENOUS

## 2021-08-21 MED ORDER — DEXAMETHASONE SODIUM PHOSPHATE 10 MG/ML IJ SOLN
INTRAMUSCULAR | Status: DC | PRN
  Administered 2021-08-21: 4 mg via INTRAVENOUS

## 2021-08-21 MED ORDER — LACTATED RINGERS IV BOLUS: 500.0000 mL | Freq: Once | INTRAVENOUS | Status: DC

## 2021-08-21 MED ORDER — LABETALOL HCL 5 MG/ML IV SOLN: 40.0000 mg | INTRAVENOUS | Status: DC | PRN

## 2021-08-21 MED ORDER — SIMETHICONE 80 MG PO CHEW: 80.0000 mg | CHEWABLE_TABLET | ORAL | Status: DC | PRN

## 2021-08-21 MED ORDER — KETOROLAC TROMETHAMINE 30 MG/ML IJ SOLN: 30.0000 mg | Freq: Once | INTRAMUSCULAR | Status: DC | PRN

## 2021-08-21 MED ORDER — MAGNESIUM SULFATE 40 GM/1000ML IV SOLN
1.0000 g/h | INTRAVENOUS | Status: DC
  Administered 2021-08-21: 1 g/h via INTRAVENOUS
  Filled 2021-08-21: qty 1000

## 2021-08-21 MED ORDER — SCOPOLAMINE 1 MG/3DAYS TD PT72
1.0000 | MEDICATED_PATCH | Freq: Once | TRANSDERMAL | Status: AC
  Administered 2021-08-21: 1.5 mg via TRANSDERMAL

## 2021-08-21 MED ORDER — ACETAMINOPHEN 500 MG PO TABS
1000.0000 mg | ORAL_TABLET | Freq: Four times a day (QID) | ORAL | Status: DC
  Administered 2021-08-21 – 2021-08-24 (×11): 1000 mg via ORAL
  Filled 2021-08-21 (×11): qty 2

## 2021-08-21 MED ORDER — STERILE WATER FOR IRRIGATION IR SOLN
Status: DC | PRN
  Administered 2021-08-21: 1000 mL

## 2021-08-21 MED ORDER — WITCH HAZEL-GLYCERIN EX PADS: 1.0000 "application " | MEDICATED_PAD | CUTANEOUS | Status: DC | PRN

## 2021-08-21 MED ORDER — MAGNESIUM SULFATE BOLUS VIA INFUSION
4.0000 g | Freq: Once | INTRAVENOUS | Status: AC
  Administered 2021-08-21: 4 g via INTRAVENOUS
  Filled 2021-08-21: qty 1000

## 2021-08-21 MED ORDER — PHENYLEPHRINE 80 MCG/ML (10ML) SYRINGE FOR IV PUSH (FOR BLOOD PRESSURE SUPPORT)
PREFILLED_SYRINGE | INTRAVENOUS | Status: AC
  Filled 2021-08-21: qty 10

## 2021-08-21 MED ORDER — ACETAMINOPHEN 10 MG/ML IV SOLN
INTRAVENOUS | Status: AC
  Filled 2021-08-21: qty 100

## 2021-08-21 MED ORDER — HYDRALAZINE HCL 20 MG/ML IJ SOLN: 10.0000 mg | INTRAMUSCULAR | Status: DC | PRN

## 2021-08-21 MED ORDER — LACTATED RINGERS IV SOLN: INTRAVENOUS | Status: DC

## 2021-08-21 MED ORDER — SODIUM CHLORIDE 0.9 % IV SOLN
INTRAVENOUS | Status: AC
  Filled 2021-08-21: qty 5

## 2021-08-21 MED ORDER — ONDANSETRON HCL 4 MG/2ML IJ SOLN: 4.0000 mg | Freq: Once | INTRAMUSCULAR | Status: DC | PRN

## 2021-08-21 MED ORDER — ACETAMINOPHEN 500 MG PO TABS
1000.0000 mg | ORAL_TABLET | Freq: Four times a day (QID) | ORAL | Status: AC
  Administered 2021-08-22: 1000 mg via ORAL
  Filled 2021-08-21: qty 2

## 2021-08-21 MED ORDER — MORPHINE SULFATE (PF) 0.5 MG/ML IJ SOLN
INTRAMUSCULAR | Status: AC
  Filled 2021-08-21: qty 10

## 2021-08-21 MED ORDER — LABETALOL HCL 200 MG PO TABS
400.0000 mg | ORAL_TABLET | Freq: Three times a day (TID) | ORAL | Status: DC
  Administered 2021-08-21 – 2021-08-22 (×6): 400 mg via ORAL
  Filled 2021-08-21 (×6): qty 2

## 2021-08-21 MED ORDER — OXYCODONE HCL 5 MG PO TABS
5.0000 mg | ORAL_TABLET | Freq: Four times a day (QID) | ORAL | Status: DC | PRN
  Administered 2021-08-21: 5 mg via ORAL
  Filled 2021-08-21: qty 2

## 2021-08-21 MED ORDER — MAGNESIUM SULFATE BOLUS VIA INFUSION: 6.0000 g | Freq: Once | INTRAVENOUS | Status: DC

## 2021-08-21 MED ORDER — NALOXONE HCL 0.4 MG/ML IJ SOLN: 0.4000 mg | INTRAMUSCULAR | Status: DC | PRN

## 2021-08-21 MED ORDER — SENNOSIDES-DOCUSATE SODIUM 8.6-50 MG PO TABS
2.0000 | ORAL_TABLET | Freq: Every day | ORAL | Status: DC
  Administered 2021-08-22 – 2021-08-23 (×2): 2 via ORAL
  Filled 2021-08-21 (×3): qty 2

## 2021-08-21 MED ORDER — SIMETHICONE 80 MG PO CHEW
80.0000 mg | CHEWABLE_TABLET | Freq: Three times a day (TID) | ORAL | Status: DC
  Administered 2021-08-21 – 2021-08-24 (×10): 80 mg via ORAL
  Filled 2021-08-21 (×10): qty 1

## 2021-08-21 MED ORDER — FENTANYL CITRATE (PF) 100 MCG/2ML IJ SOLN
INTRAMUSCULAR | Status: AC
Start: 2021-08-21 — End: ?
  Filled 2021-08-21: qty 2

## 2021-08-21 MED ORDER — SCOPOLAMINE 1 MG/3DAYS TD PT72
MEDICATED_PATCH | TRANSDERMAL | Status: AC
  Filled 2021-08-21: qty 1

## 2021-08-21 MED ORDER — MEPERIDINE HCL 25 MG/ML IJ SOLN: 6.2500 mg | INTRAMUSCULAR | Status: DC | PRN

## 2021-08-21 MED ORDER — LABETALOL HCL 5 MG/ML IV SOLN: 80.0000 mg | INTRAVENOUS | Status: DC | PRN

## 2021-08-21 MED ORDER — FUROSEMIDE 10 MG/ML IJ SOLN: INTRAMUSCULAR | Status: DC | PRN

## 2021-08-21 MED ORDER — SODIUM CHLORIDE 0.9 % IV SOLN: 500.0000 mg | INTRAVENOUS | Status: DC

## 2021-08-21 MED ORDER — DIPHENHYDRAMINE HCL 25 MG PO CAPS: 25.0000 mg | ORAL_CAPSULE | Freq: Four times a day (QID) | ORAL | Status: DC | PRN

## 2021-08-21 MED ORDER — DIPHENHYDRAMINE HCL 50 MG/ML IJ SOLN
12.5000 mg | INTRAMUSCULAR | Status: DC | PRN
Start: 2021-08-21 — End: 2021-08-24

## 2021-08-21 MED ORDER — MAGNESIUM SULFATE 40 GM/1000ML IV SOLN: 1.0000 g/h | INTRAVENOUS | Status: AC

## 2021-08-21 MED ORDER — HYDROMORPHONE HCL 1 MG/ML IJ SOLN: 0.2500 mg | INTRAMUSCULAR | Status: DC | PRN

## 2021-08-21 MED ORDER — LIDOCAINE-EPINEPHRINE (PF) 2 %-1:200000 IJ SOLN
INTRAMUSCULAR | Status: DC | PRN
  Administered 2021-08-21: 4 mL via EPIDURAL
  Administered 2021-08-21: 2 mL via EPIDURAL
  Administered 2021-08-21: 10 mL via EPIDURAL

## 2021-08-21 MED ORDER — DEXMEDETOMIDINE (PRECEDEX) IN NS 20 MCG/5ML (4 MCG/ML) IV SYRINGE
PREFILLED_SYRINGE | INTRAVENOUS | Status: DC | PRN
  Administered 2021-08-21 (×2): 8 ug via INTRAVENOUS

## 2021-08-21 MED ORDER — MAGNESIUM SULFATE 40 GM/1000ML IV SOLN: 1.0000 g/h | INTRAVENOUS | Status: DC

## 2021-08-21 MED ORDER — AMISULPRIDE (ANTIEMETIC) 5 MG/2ML IV SOLN: 10.0000 mg | Freq: Once | INTRAVENOUS | Status: DC | PRN

## 2021-08-21 MED ORDER — SODIUM CHLORIDE 0.9 % IV SOLN
500.0000 mg | Freq: Once | INTRAVENOUS | Status: AC
  Administered 2021-08-21: 500 mg via INTRAVENOUS

## 2021-08-21 MED ORDER — LABETALOL HCL 5 MG/ML IV SOLN: 20.0000 mg | INTRAVENOUS | Status: DC | PRN

## 2021-08-21 MED ORDER — OXYTOCIN-SODIUM CHLORIDE 30-0.9 UT/500ML-% IV SOLN: 2.5000 [IU]/h | INTRAVENOUS | Status: AC

## 2021-08-21 MED ORDER — ALBUTEROL SULFATE (2.5 MG/3ML) 0.083% IN NEBU: 3.0000 mL | INHALATION_SOLUTION | Freq: Four times a day (QID) | RESPIRATORY_TRACT | Status: DC | PRN

## 2021-08-21 MED ORDER — TETANUS-DIPHTH-ACELL PERTUSSIS 5-2.5-18.5 LF-MCG/0.5 IM SUSY: 0.5000 mL | PREFILLED_SYRINGE | Freq: Once | INTRAMUSCULAR | Status: DC

## 2021-08-21 MED ORDER — OXYTOCIN-SODIUM CHLORIDE 30-0.9 UT/500ML-% IV SOLN
INTRAVENOUS | Status: AC
  Filled 2021-08-21: qty 500

## 2021-08-21 MED ORDER — MORPHINE SULFATE (PF) 0.5 MG/ML IJ SOLN
INTRAMUSCULAR | Status: DC | PRN
  Administered 2021-08-21: 3 mg via EPIDURAL

## 2021-08-21 MED ORDER — SODIUM CHLORIDE 0.9 % IV SOLN
1.0000 g | Freq: Two times a day (BID) | INTRAVENOUS | Status: AC
  Administered 2021-08-21 (×2): 1 g via INTRAVENOUS
  Filled 2021-08-21 (×3): qty 1

## 2021-08-21 MED ORDER — DIPHENHYDRAMINE HCL 25 MG PO CAPS: 25.0000 mg | ORAL_CAPSULE | ORAL | Status: DC | PRN

## 2021-08-21 MED ORDER — ENOXAPARIN SODIUM 30 MG/0.3ML IJ SOSY
30.0000 mg | PREFILLED_SYRINGE | INTRAMUSCULAR | Status: DC
  Administered 2021-08-22: 30 mg via SUBCUTANEOUS
  Filled 2021-08-21: qty 0.3

## 2021-08-21 MED ORDER — FENTANYL CITRATE (PF) 100 MCG/2ML IJ SOLN
INTRAMUSCULAR | Status: AC
  Filled 2021-08-21: qty 2

## 2021-08-21 MED ORDER — MORPHINE SULFATE (PF) 0.5 MG/ML IJ SOLN
INTRAMUSCULAR | Status: DC | PRN
  Administered 2021-08-21: 2 mg via INTRAVENOUS

## 2021-08-21 MED ORDER — PHENYLEPHRINE 80 MCG/ML (10ML) SYRINGE FOR IV PUSH (FOR BLOOD PRESSURE SUPPORT)
PREFILLED_SYRINGE | INTRAVENOUS | Status: DC | PRN
  Administered 2021-08-21 (×2): 80 ug via INTRAVENOUS

## 2021-08-21 MED ORDER — PRENATAL MULTIVITAMIN CH
1.0000 | ORAL_TABLET | Freq: Every day | ORAL | Status: DC
  Administered 2021-08-21 – 2021-08-24 (×4): 1 via ORAL
  Filled 2021-08-21 (×4): qty 1

## 2021-08-21 MED ORDER — COCONUT OIL OIL: 1.0000 "application " | TOPICAL_OIL | Status: DC | PRN

## 2021-08-21 MED ORDER — DIBUCAINE (PERIANAL) 1 % EX OINT: 1.0000 "application " | TOPICAL_OINTMENT | CUTANEOUS | Status: DC | PRN

## 2021-08-21 MED ORDER — FENTANYL CITRATE (PF) 100 MCG/2ML IJ SOLN
INTRAMUSCULAR | Status: DC | PRN
  Administered 2021-08-21: 100 ug via INTRAVENOUS
  Administered 2021-08-21 (×2): 50 ug via INTRAVENOUS

## 2021-08-21 SURGICAL SUPPLY — 38 items
APL SKNCLS STERI-STRIP NONHPOA (GAUZE/BANDAGES/DRESSINGS) ×1
BARRIER ADHS 3X4 INTERCEED (GAUZE/BANDAGES/DRESSINGS) IMPLANT
BENZOIN TINCTURE PRP APPL 2/3 (GAUZE/BANDAGES/DRESSINGS) ×1 IMPLANT
BRR ADH 4X3 ABS CNTRL BYND (GAUZE/BANDAGES/DRESSINGS)
CHLORAPREP W/TINT 26ML (MISCELLANEOUS) ×6 IMPLANT
CLAMP CORD UMBIL (MISCELLANEOUS) ×3 IMPLANT
CLOSURE STERI STRIP 1/2 X4 (GAUZE/BANDAGES/DRESSINGS) ×1 IMPLANT
CLOTH BEACON ORANGE TIMEOUT ST (SAFETY) ×3 IMPLANT
DRSG OPSITE POSTOP 4X10 (GAUZE/BANDAGES/DRESSINGS) ×3 IMPLANT
ELECT REM PT RETURN 9FT ADLT (ELECTROSURGICAL)
ELECTRODE REM PT RTRN 9FT ADLT (ELECTROSURGICAL) ×2 IMPLANT
EXTRACTOR VACUUM KIWI (MISCELLANEOUS) IMPLANT
GAUZE SPONGE 4X4 12PLY STRL LF (GAUZE/BANDAGES/DRESSINGS) ×2 IMPLANT
GLOVE BIO SURGEON STRL SZ 6.5 (GLOVE) ×3 IMPLANT
GLOVE BIOGEL PI IND STRL 7.0 (GLOVE) ×4 IMPLANT
GLOVE BIOGEL PI INDICATOR 7.0 (GLOVE) ×2
GOWN STRL REUS W/TWL LRG LVL3 (GOWN DISPOSABLE) ×6 IMPLANT
KIT ABG SYR 3ML LUER SLIP (SYRINGE) IMPLANT
NDL HYPO 25X5/8 SAFETYGLIDE (NEEDLE) IMPLANT
NEEDLE HYPO 22GX1.5 SAFETY (NEEDLE) IMPLANT
NEEDLE HYPO 25X5/8 SAFETYGLIDE (NEEDLE) IMPLANT
NS IRRIG 1000ML POUR BTL (IV SOLUTION) ×3 IMPLANT
PACK C SECTION WH (CUSTOM PROCEDURE TRAY) ×3 IMPLANT
PAD ABD 7.5X8 STRL (GAUZE/BANDAGES/DRESSINGS) ×1 IMPLANT
PAD OB MATERNITY 4.3X12.25 (PERSONAL CARE ITEMS) ×3 IMPLANT
RETRACTOR WND ALEXIS 25 LRG (MISCELLANEOUS) IMPLANT
RTRCTR WOUND ALEXIS 25CM LRG (MISCELLANEOUS)
SUT PLAIN 2 0 (SUTURE) ×2
SUT PLAIN ABS 2-0 CT1 27XMFL (SUTURE) IMPLANT
SUT VIC AB 0 CT1 36 (SUTURE) ×18 IMPLANT
SUT VIC AB 2-0 CT1 27 (SUTURE) ×2
SUT VIC AB 2-0 CT1 TAPERPNT 27 (SUTURE) ×2 IMPLANT
SUT VIC AB 4-0 KS 27 (SUTURE) ×1 IMPLANT
SUT VIC AB 4-0 PS2 27 (SUTURE) ×3 IMPLANT
SYR CONTROL 10ML LL (SYRINGE) IMPLANT
TOWEL OR 17X24 6PK STRL BLUE (TOWEL DISPOSABLE) ×3 IMPLANT
TRAY FOLEY W/BAG SLVR 14FR LF (SET/KITS/TRAYS/PACK) IMPLANT
WATER STERILE IRR 1000ML POUR (IV SOLUTION) ×3 IMPLANT

## 2021-08-21 NOTE — Op Note (Signed)
Operative Note  ? ?Patient: Tara Chase ? ?Date of Procedure: 08/21/2021 ? ?Procedure: Primary Low Transverse Cesarean  ? ?Indications:  Worsening maternal status (cHTN with super imposed severe pre-eclampsia - uptrending creatinine), remote from delivery/arrest of dilation  ? ?Pre-operative Diagnosis: primary cesarean section due to failure to progress.  ? ?Post-operative Diagnosis: Same ? ?TOLAC Candidate: Yes  ? ?Surgeon: Juliann Mule) and Role: ?   * Woodroe Mode, MD - Primary ?   Renard Matter, MD - Assisting ? ? ?An experienced assistant was required given the standard of surgical care given the complexity of the case.  This assistant was needed for exposure, dissection, suctioning, retraction, instrument exchange, assisting with delivery with administration of fundal pressure, and for overall help during the procedure.  ? ?Anesthesia: epidural ? ?Anesthesiologist: Dr. Elgie Congo ? ?Antibiotics: Azithromycin and Ampicillin ?  ?Estimated Blood Loss: 386 ml  ? ?Total IV Fluids: 700 ml ? ?Urine Output: Anuria ? ?Specimens: placenta intact, 3V cord, to L&D  ? ?Complications:  none   ? ?Indications: ?Tara Chase is a 26 y.o. G1P1001 with an IUP [redacted]w[redacted]d presenting for unscheduled, urgent cesarean secondary to the indications listed above. Clinical course notable for patient presented for IOL for cHTN and received cytotec x2, foley balloon, and pitocin. She was AROMed dilated to 6-6.5 cm but despite being on pitocin for ~20 hours and AROM for ~18 hours and her contraction pattern was not adequate. She also became anuric with an uptrending creatinine for the last 8 hours of her induction and did not resolve despite getting fluid boluses and maintenance fluid. Given her history of cHTN and the above findings she was thought to have cHTN with SIPE with SF (Cr 1.98). She also developed Triple I. Given worsening maternal status and remote from delivery/arrest of dilation a cesarean delivery was  recommended. ? ? ?Findings: Viable infant in cephalic presentation, no nuchal cord present. Apgars 8 , 8 , . Weight  pendingClear amniotic fluid. Normal placenta. Normal uterus. ? ?Procedure Details: A Time Out was held and the above information confirmed. The patient received intravenous antibiotics and had sequential compression devices applied to her lower extremities preoperatively. The patient was taken back to the operative suite where epidural anesthesia was dosed to surgical dosing. After induction of anesthesia, the patient was draped and prepped in the usual sterile manner and placed in a dorsal supine position with a leftward tilt. A low transverse skin incision was made with scalpel and carried down through the subcutaneous tissue to the fascia. Fascial incision was made and extended transversely. The fascia was separated from the underlying rectus tissue superiorly and inferiorly. The rectus muscles were separated in the midline bluntly and the peritoneum was entered bluntly. At that time it was noted that her bladder was high and had an edematous appearance externally. More room was made at the fascia and the skin edges and  An Alexis retractor was placed to aid in visualization of the uterus. The utero-vesical peritoneal reflection was incised transversely and the bladder flap was bluntly freed from the lower uterine segment. A bladder blade was then placed to help retract bladder down. A low transverse uterine incision was made. The infant was successfully delivered from cephalic presentation (occiput posterior and asynclitic), the umbilical cord was clamped after 1 minute. Cord ph was not sent, and cord blood was obtained for evaluation. The placenta was removed Intact and appeared normal. The uterine incision was closed with running locked sutures of 0-Vicryl and  then a second imbricating layer was also placed with 0-Vicryl. There was a small extension inferiorly at left edge of hysterotomy that  was also closed using 0-vicryl. Overall, excellent hemostasis was noted. The abdomen and the pelvis were cleared of all clot and debris and the Ubaldo Glassing was removed. Hemostasis was confirmed on all surfaces.  The peritoneum was reapproximated using 2-0 vicryl . The fascia was then closed using 0 Vicryl in a running fashion. The subcutaneous layer was reapproximated with plain gut and the skin was closed with a 4-0 vicryl subcuticular stitch. The patient tolerated the procedure well. Sponge, lap, instrument and needle counts were correct x 2. She was taken to the recovery room in stable condition. ? ?Disposition: PACU - hemodynamically stable.  ? ? ?Signed: ?Renard Matter, MD, MPH ?[facility] [employer] [facility]) ? ?

## 2021-08-21 NOTE — Plan of Care (Signed)
?  Problem: Clinical Measurements: ?Goal: Ability to maintain clinical measurements within normal limits will improve ?Outcome: Progressing ?  ?Problem: Education: ?Goal: Knowledge of condition will improve ?Outcome: Progressing ?Goal: Individualized Educational Video(s) ?Outcome: Not Applicable ?Goal: Individualized Newborn Educational Video(s) ?Outcome: Not Applicable ?  ?Problem: Activity: ?Goal: Will verbalize the importance of balancing activity with adequate rest periods ?Outcome: Progressing ?Goal: Ability to tolerate increased activity will improve ?Outcome: Progressing ?  ?Problem: Coping: ?Goal: Ability to identify and utilize available resources and services will improve ?Outcome: Progressing ?  ?Problem: Life Cycle: ?Goal: Chance of risk for complications during the postpartum period will decrease ?Outcome: Progressing ?  ?

## 2021-08-21 NOTE — Progress Notes (Addendum)
Tara Chase is a 26 y.o. G1P0000 at [redacted]w[redacted]d admitted for IOL due to [place] now with severe preEclampsia (Cr >1.1) ? ?Subjective: ?Patient reports she has become increasing uncomfortable and experiencing pain with contractions. UOP remains low. ? ?Objective: ?BP (!) 152/101   Pulse (!) 101   Temp 99.5 ?F (37.5 ?C) (Axillary)   Resp 16   Ht 5\' 5"  (1.651 m)   Wt 101.1 kg   LMP 11/19/2020 (Exact Date)   SpO2 98%   BMI 37.09 kg/m?  ?I/O last 3 completed shifts: ?In: -  ?Out: 950 [Urine:950] ?Total I/O ?In: -  ?Out: 10 [Urine:10] ? ?FHT:  FHR: 135 bpm, variability: moderate,  accelerations:  Present,  decelerations:  Absent ?UC:   regular, every 2-3 minutes ?SVE:   Dilation: 6.5 ?Effacement (%): 80 ?Station: -2, -3 ?Exam by:: Dr. Cy Blamer ? ?Labs: ?Lab Results  ?Component Value Date  ? WBC 23.8 (H) 08/21/2021  ? HGB 12.2 08/21/2021  ? HCT 36.6 08/21/2021  ? MCV 92.2 08/21/2021  ? PLT 247 08/21/2021  ? ? ?Assessment / Plan: ?Tara Chase is a 26 y.o. G1P0000 at [redacted]w[redacted]d admitted for IOL due to cHTN, decreased UOP and Triple I. ? ?Labor: Patient's CVE is largely unchanged to minimal change, and contractions are not yet adequate - patient is experiencing increased pain with contractions. FHT is stable. Plan to continue gradually increasing Pitocin and re-evaluate when patient establishes strong pattern with contractions. ? ?Fetal Wellbeing:  Category I ? ?Pain Control:  Epidural - plan to re-dose epidural as patient's contractions have become increasingly uncomfortable. ? ?I/D:  GBS positive, patient was given PCN. Continue Amp/Gent for patient's presumed Triple I (100.6 temp around 2130). Most recent temp was 99.5. ? ?#cHTN, Decreased UOP, Uptrending Cr --> Severe Preeclampsia:   ?UOP remains low - Bladder scanner shows 197 ml. Plan to re-scan in 1 hour. ? ?Creatinine has increased to 1.97 from 1.28. UOP remains decreased. Plan for another fluid bolus and increase IV fluid rate to 150/hr. Plan to start Mg. ? ?BP in  130-140s/90s. Continue Labetalol 600 mg TID. ? ?Patient denies headaches or vision changes. If hypertensive symptoms worsen will consider C-section, but for now will continue with Pitocin course. ? ? ?Pirapat Rerkpattanapipat, Medical Student, ?08/21/2021, 12:50 AM ? ?GME ATTESTATION:  ?I saw and evaluated the patient. I agree with the findings and the plan of care as documented in the student's note and have made all necessary edits. ?See separate not for further details. ? ?Renard Matter, MD, MPH ?OB Fellow, Faculty Practice ?[place] [facility] ?08/21/2021 1:41 AM' ? ? ?

## 2021-08-21 NOTE — Transfer of Care (Signed)
Immediate Anesthesia Transfer of Care Note ? ?Patient: Tara Chase ? ?Procedure(s) Performed: CESAREAN SECTION ? ?Patient Location: PACU ? ?Anesthesia Type:Epidural ? ?Level of Consciousness: awake ? ?Airway & Oxygen Therapy: Patient Spontanous Breathing ? ?Post-op Assessment: Report given to RN and Post -op Vital signs reviewed and stable ? ?Post vital signs: Reviewed and stable ? ?Last Vitals:  ?Vitals Value Taken Time  ?BP    ?Temp    ?Pulse 100 08/21/21 0554  ?Resp 25 08/21/21 0554  ?SpO2 99 % 08/21/21 0554  ?Vitals shown include unvalidated device data. ? ?Last Pain:  ?Vitals:  ? 08/21/21 0230  ?TempSrc:   ?PainSc: 3   ?   ? ?  ? ?Complications: No notable events documented. ?

## 2021-08-21 NOTE — Discharge Summary (Signed)
? ?  Postpartum Discharge Summary ? ?Date of Service updated ? ?   ?Patient Name: Tara Chase ?DOB: [birth year] ?MRN: [id number] ? ?Date of admission: 08/19/2021 ?Delivery date:08/21/2021  ?Delivering provider: Woodroe Mode  ?Date of discharge: 08/24/2021 ? ?Admitting diagnosis: Encounter for induction of labor [Z34.90] ?Intrauterine pregnancy: [redacted]w[redacted]d    ?Secondary diagnosis:  Principal Problem: ?  Chronic hypertension with superimposed pre-eclampsia ?Active Problems: ?  Low grade squamous intraepithelial lesion (LGSIL) on cervical Pap smear ?  Supervision of high risk pregnancy, antepartum ?  GBS bacteriuria ?  Chronic hypertension affecting pregnancy ?  Gestational diabetes ?  Encounter for induction of labor ?  Anuria and oliguria ?  Acute blood loss as cause of postoperative anemia ? ?Additional problems: None    ?Discharge diagnosis: Term Pregnancy Delivered and Preeclampsia (severe)                                              ?Post partum procedures: None ?Augmentation: AROM, Pitocin, Cytotec, and IP Foley ?Complications: None ? ?Hospital course: Induction of Labor With Cesarean Section   ?26y.o. yo G1P1001 at 351w2das admitted to the hospital 08/19/2021 for induction of labor. Patient had a labor course significant patient presented for IOL for cHTN and received cytotec x2, foley balloon, and pitocin. She was AROMed dilated to 6-6.5 cm but despite being on pitocin for ~20 hours and AROM for ~18 hours and her contraction pattern was not adequate. She also became anuric with an uptrending creatinine for the last 8 hours of her induction and did not resolve despite getting fluid boluses and maintenance fluid. Given her history of cHTN and the above findings she was thought to have cHTN with SIPE with SF (Cr 1.98). She also developed Triple I. Given worsening maternal status and remote from delivery/arrest of dilation a cesarean delivery was recommended. Delivery details are as follows: ?Membrane Rupture  Time/Date: 11:32 AM ,08/20/2021   ?Delivery Method:C-Section, Low Transverse  ?Details of operation can be found in separate operative Note.   ? ?Following delivery she was on Magnesium for seizure prophylaxis and her blood pressure was titrated control on ultimately Labetalol 600 TID, Amlodipine and Lasix. Her other event was oozing from the incision which I placed pressure and then applied a pressure dressing overnight. This morning it is much improved with no bleeding or oozing. Steris applied over the incision.  She also had postoperative anemia due to blood loss and was given IV iron on POD2. Patient otherwise had an uncomplicated postpartum course. She is ambulating, tolerating a regular diet, passing flatus, and urinating well.  Patient is discharged home in stable condition on 08/24/21.     ? ?Newborn Data: ?Birth date:08/21/2021  ?Birth time:4:42 AM  ?Gender:Female  ?Living status:Living  ?Apgars:8 ,8  ?Weight:3035 g                               ? ?Magnesium Sulfate received: Yes: Neuroprotection ?BMZ received: No ?Rhophylac:N/A ?MMR:N/A ?T-DaP:Given prenatally ?Flu: N/A ?Transfusion:No ? ?Physical exam  ?Vitals:  ? 08/23/21 1717 08/23/21 2015 08/24/21 0004 08/24/21 0425  ?BP: 140/80 139/72 (!) 147/79 (!) 155/77  ?Pulse: 86 88 91 97  ?Resp:  _0 ?Temp:  98.2 ?F (36.8 ?C) 98.2 ?F (36.8 ?C) 98.8 ?F (37.1 ?C)  ?  TempSrc:  Oral Oral Oral  ?SpO2:  98% 98% 97%  ?Weight:      ?Height:      ? ?General: alert, cooperative, and no distress ?Lochia: appropriate ?Uterine Fundus: firm ?Incision: Healing well with no significant drainage ?DVT Evaluation: No cords or calf tenderness. ?Calf/Ankle edema is present ?Labs: ?Lab Results  ?Component Value Date  ? WBC 12.0 (H) 08/23/2021  ? HGB 8.7 (L) 08/23/2021  ? HCT 25.5 (L) 08/23/2021  ? MCV 93.8 08/23/2021  ? PLT 238 08/23/2021  ? ? ?  Latest Ref Rng & Units 08/23/2021  ?  6:16 AM  ?CMP  ?Glucose 70 - 99 mg/dL 88    ?BUN 6 - 20 mg/dL 14    ?Creatinine 0.44 - 1.00 mg/dL  1.16    ?Sodium 135 - 145 mmol/L 139    ?Potassium 3.5 - 5.1 mmol/L 4.0    ?Chloride 98 - 111 mmol/L 109    ?CO2 22 - 32 mmol/L 22    ?Calcium 8.9 - 10.3 mg/dL 8.3    ?Total Protein 6.5 - 8.1 g/dL 5.1    ?Total Bilirubin 0.3 - 1.2 mg/dL 0.5    ?Alkaline Phos 38 - 126 U/L 132    ?AST 15 - 41 U/L 26    ?ALT 0 - 44 U/L 17    ? ?Edinburgh Score: ? ?  08/23/2021  ?  9:37 PM  ?Flavia Shipper Postnatal Depression Scale Screening Tool  ?I have been able to laugh and see the funny side of things. 1  ?I have looked forward with enjoyment to things. 0  ?I have blamed myself unnecessarily when things went wrong. 0  ?I have been anxious or worried for no good reason. 1  ?I have felt scared or panicky for no good reason. 1  ?Things have been getting on top of me. 2  ?I have been so unhappy that I have had difficulty sleeping. 1  ?I have felt sad or miserable. 1  ?I have been so unhappy that I have been crying. 1  ?The thought of harming myself has occurred to me. 0  ?Edinburgh Postnatal Depression Scale Total 8  ? ? ? ?After visit meds:  ?Allergies as of 08/24/2021   ? ?   Reactions  ? Pollen Extract Other (See Comments)  ? Seasonal allergies  ? ?  ? ?  ?Medication List  ?  ? ?STOP taking these medications   ? ?Accu-Chek Softclix Lancets lancets ?  ?aspirin EC 81 MG tablet ?  ?cyclobenzaprine 10 MG tablet ?Commonly known as: FLEXERIL ?  ?glucose blood test strip ?  ?metFORMIN 500 MG tablet ?Commonly known as: GLUCOPHAGE ?  ? ?  ? ?TAKE these medications   ? ?Accu-Chek Guide w/Device Kit ?DX: O24.419. Check BS QID. ?  ?acetaminophen 500 MG tablet ?Commonly known as: TYLENOL ?Take 2 tablets (1,000 mg total) by mouth every 6 (six) hours. ?  ?albuterol 108 (90 Base) MCG/ACT inhaler ?Commonly known as: VENTOLIN HFA ?Inhale 1-2 puffs into the lungs every 6 (six) hours as needed for wheezing or shortness of breath. ?  ?amLODipine 10 MG tablet ?Commonly known as: NORVASC ?Take 1 tablet (10 mg total) by mouth daily. ?  ?furosemide 20 MG  tablet ?Commonly known as: LASIX ?Take 1 tablet (20 mg total) by mouth 2 (two) times daily. ?  ?ibuprofen 600 MG tablet ?Commonly known as: ADVIL ?Take 1 tablet (600 mg total) by mouth every 6 (six) hours as needed. ?  ?labetalol 300  MG tablet ?Commonly known as: NORMODYNE ?Take 2 tablets (600 mg total) by mouth 3 (three) times daily. ?What changed:  ?medication strength ?how much to take ?  ?oxyCODONE 5 MG immediate release tablet ?Commonly known as: Oxy IR/ROXICODONE ?Take 1-2 tablets (5-10 mg total) by mouth every 6 (six) hours as needed for severe pain or moderate pain. ?  ?PRENATAL VITAMINS PO ?Take by mouth. ?  ? ?  ? ?  ?  ? ? ?  ?Discharge Care Instructions  ?(From admission, onward)  ?  ? ? ?  ? ?  Start     Ordered  ? 08/24/21 0000  Discharge wound care:       ?Comments: If dressing is present, remove after 7 days from surgery  ? 08/24/21 0753  ? ?  ?  ? ?  ? ? ? ?Discharge home in stable condition ?Infant Feeding: Bottle ?Infant Disposition:home with mother ?Discharge instruction: per After Visit Summary and Postpartum booklet. ?Activity: Advance as tolerated. Pelvic rest for 6 weeks.  ?Diet: routine diet and low salt diet ?Future Appointments: ?Future Appointments  ?Date Time Provider [place]  ?09/02/2021 10:15 AM Truett Mainland, DO [facility] None  ?09/23/2021  1:30 PM Truett Mainland, DO [facility] None  ? ?Follow up Visit: ? Follow-up Information   ? ? [facility] Follow up in 1 week(s).   ?Specialty: Obstetrics and Gynecology ?Contact information: ?[address] ?[place] [phone] ?[phone] ? ?  ?  ? ?  ?  ? ?  ? ?Message sent to [facility] by Dr. Cy Blamer on 4/22 ? ?Please schedule this patient for a In person postpartum visit in 4 weeks with the following provider: MD. ?Additional Postpartum F/U:2 hour GTT, Incision check 1 week, and BP check 1 week  ?High risk pregnancy complicated by:  GDM and cHTN with SIPE with severe  features ?Delivery mode:  C-Section, Low Transverse  ?Anticipated Birth Control:  Unsure but she knows she would like birth control. Will place information in her AVS.  ? ? ?08/24/2021 ?Radene Gunning, MD ? ? ? ?

## 2021-08-21 NOTE — Progress Notes (Addendum)
Reassessed patient at bedside.  ? ?Now s/p 500cc IV bolus x 2 since ~8PM as well as maintenance fluids at 125-150/hr since then (including 150/hr since last bolus). Patient also now on Mg for cHTN with super imposed preE with SF (cr uptrend to 1.9).  ? ?Last bladder scan with 197cc of UOP in bladder. However has not had any output in bag. Repeat bladder scan with 2cc of urine and no output still in foley catheter bag.  ? ?Patient's foley catheter bag replaced. Previous bag had two clots occluding catheter. New catheter placed by ROB without difficulty and no resistance met. However, no urine return in new foley catheter or bag. ? ?Discussed findings in detail with patient. At this time 10cc of UOP since 7PM (~8.5 hours). Last UOP bloody. We discussed concerns regarding no urine output and worsening creatinine.  ? ?Pitocin is now at 30/hr however cervix remains unchanged at 6-6.5cm since 4 PM (~12 hours) despite pitocin for ~20 hours and AROM for 18 hours.  ? ?Concern for worsening preE with SF (given no UOP and uptrending Cr) and remote from delivery. We discussed in depth regarding that if she did not have worsening preE we would continue uptitrating pitocin or consider a pitocin break to reset receptors and then increase pitocin to hopefully facilitate adequate contractions and in turn dilation of cervix. ? ? However at this time concern for maternal status given her worsening severe preE and would recommend moving forward with cesarean delivery. Also offered Cr recheck now to assess kidney function prior to making final decision. Patient opts to move forward with cesarean delivery as opposed to delaying to reassess kidney function. ? ?Dr. Roselie Awkward notified and in agreement with plan. ? ?The risks of cesarean section were discussed with the patient including but were not limited to: bleeding which may require transfusion or reoperation; infection which may require antibiotics; injury to bowel, bladder, ureters or  other surrounding organs; injury to the fetus; need for additional procedures including hysterectomy in the event of a life-threatening hemorrhage; placental abnormalities wth subsequent pregnancies, incisional problems, thromboembolic phenomenon and other postoperative/anesthesia complications. Patient has been NPO since yesterday she will remain NPO for procedure. Anesthesia and OR aware.  Preoperative prophylactic antibiotics and SCDs ordered on call to the OR.  To OR when ready. ? ? ?Renard Matter, MD, MPH ?OB Fellow, Faculty Practice ? ? ? ?

## 2021-08-21 NOTE — Progress Notes (Signed)
Reassessed patient at bedside. Foley catheter bag still with ~20 cc of UOP since ~7 PM. Urine appears bloody. ? ?Bladder scan done: 197cc in bladder ? ?Midnight labs back:  ?Cr trend 0.78--> 1.28--> 1.97 ? ?At this time given worsening kidney function will presume cHTN with super imposed pre-eclampsia with severe features.  ?Lungs clear to auscultation ?Lower extremities: edematous but unchanged from prior, trace to 1+ pitting.  ?- Additional fluid bolus (500cc now) and will increase maintenance fluids to 150/hr ?- will start Mg for seizure PPX given consistently worsening Cr in setting of cHTN (will do 4g bolus and 1g/hr given cr elevated) ?- not enough urine at this time to get p:c and additionally with bloody urine may be confounded. ?- will get repeat CBC and CMP at 6 AM ? ?In terms of Labor process: ?Cervix minimally changed (6 to 6.5cm since last check). Check done as patient feeling uncomfortable to assess prior to epidural re-bolus. Will continue to uptitrate pitocin as still not having adequate contractions.  ?- If kidney function worsens with recheck of labs and cervix continues to be unchanged plan for discussion regarding cesarean delivery at that time. ? ?Discussed in depth with patient, bedside RN team. ? ?Also discussed with Dr. Roselie Awkward who is in agreement with plan.  ? ?Renard Matter, MD, MPH ?OB Fellow, Faculty Practice ? ?

## 2021-08-21 NOTE — Anesthesia Postprocedure Evaluation (Signed)
Anesthesia Post Note ? ?Patient: Tara Chase ? ?Procedure(s) Performed: CESAREAN SECTION ? ?  ? ?Patient location during evaluation: PACU ?Anesthesia Type: Epidural ?Level of consciousness: awake and alert and oriented ?Pain management: pain level controlled ?Vital Signs Assessment: post-procedure vital signs reviewed and stable ?Respiratory status: spontaneous breathing, nonlabored ventilation and respiratory function stable ?Cardiovascular status: blood pressure returned to baseline and stable ?Postop Assessment: no headache, no backache, patient able to bend at knees and epidural receding ?Anesthetic complications: no ? ? ?No notable events documented. ? ?Last Vitals:  ?Vitals:  ? 08/21/21 1531 08/21/21 1630  ?BP: 133/71   ?Pulse: 96   ?Resp: 18 17  ?Temp: 36.6 ?C   ?SpO2: 99%   ?  ?Last Pain:  ?Vitals:  ? 08/21/21 1607  ?TempSrc:   ?PainSc: 8   ? ?Pain Goal:   ? ?  ?  ?  ?  ?  ?  ?  ? ?[name] ? ? ? ? ?

## 2021-08-21 NOTE — Lactation Note (Signed)
This note was copied from a baby's chart. ?Lactation Consultation Note ? ?Patient Name: Tara Chase ?Today's Date: 08/21/2021 ?  ?Age:28 hours ? ?LC attempted to consult with Tara Chase.  RN said that Tara Chase declined LC service.  Tara Chase is choosing to formula feed and may want to pump later. LC asked RN to call if Tara Chase changes her mind and would like lactation to visit. ? ? ?[name] ?08/21/2021, 11:16 AM ? ? ? ?

## 2021-08-22 ENCOUNTER — Encounter (HOSPITAL_COMMUNITY): Payer: Self-pay | Admitting: Obstetrics & Gynecology

## 2021-08-22 LAB — CBC
HCT: 25.2 % — ABNORMAL LOW (ref 36.0–46.0)
Hemoglobin: 8.5 g/dL — ABNORMAL LOW (ref 12.0–15.0)
MCH: 31.8 pg (ref 26.0–34.0)
MCHC: 33.7 g/dL (ref 30.0–36.0)
MCV: 94.4 fL (ref 80.0–100.0)
Platelets: 196 10*3/uL (ref 150–400)
RBC: 2.67 MIL/uL — ABNORMAL LOW (ref 3.87–5.11)
RDW: 13.8 % (ref 11.5–15.5)
WBC: 18.1 10*3/uL — ABNORMAL HIGH (ref 4.0–10.5)
nRBC: 0 % (ref 0.0–0.2)

## 2021-08-22 LAB — COMPREHENSIVE METABOLIC PANEL
ALT: 17 U/L (ref 0–44)
AST: 31 U/L (ref 15–41)
Albumin: 2 g/dL — ABNORMAL LOW (ref 3.5–5.0)
Alkaline Phosphatase: 134 U/L — ABNORMAL HIGH (ref 38–126)
Anion gap: 8 (ref 5–15)
BUN: 15 mg/dL (ref 6–20)
CO2: 21 mmol/L — ABNORMAL LOW (ref 22–32)
Calcium: 8.2 mg/dL — ABNORMAL LOW (ref 8.9–10.3)
Chloride: 105 mmol/L (ref 98–111)
Creatinine, Ser: 1.61 mg/dL — ABNORMAL HIGH (ref 0.44–1.00)
GFR, Estimated: 45 mL/min — ABNORMAL LOW (ref >60)
Glucose, Bld: 96 mg/dL (ref 70–99)
Potassium: 3.9 mmol/L (ref 3.5–5.1)
Sodium: 134 mmol/L — ABNORMAL LOW (ref 135–145)
Total Bilirubin: 0.1 mg/dL — ABNORMAL LOW (ref 0.3–1.2)
Total Protein: 4.8 g/dL — ABNORMAL LOW (ref 6.5–8.1)

## 2021-08-22 MED ORDER — FUROSEMIDE 20 MG PO TABS
20.0000 mg | ORAL_TABLET | Freq: Two times a day (BID) | ORAL | Status: DC
  Administered 2021-08-22 – 2021-08-24 (×5): 20 mg via ORAL
  Filled 2021-08-22 (×5): qty 1

## 2021-08-22 MED ORDER — ENOXAPARIN SODIUM 40 MG/0.4ML IJ SOSY
40.0000 mg | PREFILLED_SYRINGE | INTRAMUSCULAR | Status: DC
  Administered 2021-08-23 – 2021-08-24 (×2): 40 mg via SUBCUTANEOUS
  Filled 2021-08-22 (×2): qty 0.4

## 2021-08-22 NOTE — Progress Notes (Signed)
POSTPARTUM PROGRESS NOTE ? ?POD #1 ? ?Subjective: ? ?Chany SHAKERRA RED is a 26 y.o. G1P1001 s/p primary LTCS at [redacted]w[redacted]d.  She reports she doing well. No acute events overnight. She denies any problems with ambulating, voiding or po intake. Denies nausea or vomiting. She has not passed flatus. Pain is well controlled.  Lochia is normal. Pt is tolerating regular diet ? ?Objective: ?Blood pressure 114/63, pulse 88, temperature 98.1 ?F (36.7 ?C), temperature source Oral, resp. rate 17, height 5\' 5"  (1.651 m), weight 101.1 kg, last menstrual period 11/19/2020, SpO2 97 %, unknown if currently breastfeeding. ? ?Physical Exam:  ?General: alert, cooperative and no distress ?Chest: no respiratory distress ?Heart:regular rate, distal pulses intact ?Abdomen: soft, nontender, excellent bowel sounds ?Uterine Fundus: firm, appropriately tender ?DVT Evaluation: No calf swelling or tenderness ?Extremities: trace bilateral LE edema ?Skin: warm, dry; incision clean/dry/intact w/ pressure dressing dressing in place ? ?Recent Labs  ?  08/21/21 ?0624 08/22/21 ?0446  ?HGB 11.2* 8.5*  ?HCT 32.6* 25.2*  ? ? ?  Latest Ref Rng & Units 08/22/2021  ?  4:46 AM 08/21/2021  ?  6:24 AM 08/21/2021  ? 12:12 AM  ?CMP  ?Glucose 70 - 99 mg/dL 96   08/23/2021   644    ?BUN 6 - 20 mg/dL 15   13   11     ?Creatinine 0.44 - 1.00 mg/dL 034     7.42    ?Sodium 135 - 145 mmol/L 134   130   130    ?Potassium 3.5 - 5.1 mmol/L 3.9   4.6   4.2    ?Chloride 98 - 111 mmol/L 105   101   103    ?CO2 22 - 32 mmol/L 21   19   17     ?Calcium 8.9 - 10.3 mg/dL 8.2   8.5   9.0    ?Total Protein 6.5 - 8.1 g/dL 4.8   5.3   6.0    ?Total Bilirubin 0.3 - 1.2 mg/dL 0.1   0.7   0.5    ?Alkaline Phos 38 - 126 U/L 134   183   200    ?AST 15 - 41 U/L 31   28   27     ?ALT 0 - 44 U/L 17   16   18     ?  ?Intake/Output Summary (Last 24 hours) at 08/22/2021 0732 ?Last data filed at 08/22/2021 0500 ?Gross per 24 hour  ?Intake 3029.75 ml  ?Output 4375 ml  ?Net -1345.25 ml  ?   ? ?Assessment/Plan: ?ALEAYAH CHICO is a 26 y.o. G1P1001 s/p LTCS at [redacted]w[redacted]d for pre-eclampsia with severe features. ? ?POD#1 -  ?Feeding: bottle ? ?Continue routine postpartum care ?Continue to follow improvement in kidney function ?Excellent diuresis, start lasix per protocol ?Awaiting flatus, but bowel function otherwise improving ?LFT normal ? ? ? LOS: 3 days  ? ?08/24/2021, Md ?Faculty Attending, Center for 08/24/2021 ?08/22/2021, 7:30 AM  ?

## 2021-08-23 ENCOUNTER — Other Ambulatory Visit: Payer: Medicaid Other

## 2021-08-23 DIAGNOSIS — D62 Acute posthemorrhagic anemia: Secondary | ICD-10-CM | POA: Diagnosis not present

## 2021-08-23 LAB — CBC
HCT: 25.5 % — ABNORMAL LOW (ref 36.0–46.0)
Hemoglobin: 8.7 g/dL — ABNORMAL LOW (ref 12.0–15.0)
MCH: 32 pg (ref 26.0–34.0)
MCHC: 34.1 g/dL (ref 30.0–36.0)
MCV: 93.8 fL (ref 80.0–100.0)
Platelets: 238 10*3/uL (ref 150–400)
RBC: 2.72 MIL/uL — ABNORMAL LOW (ref 3.87–5.11)
RDW: 13.7 % (ref 11.5–15.5)
WBC: 12 10*3/uL — ABNORMAL HIGH (ref 4.0–10.5)
nRBC: 0 % (ref 0.0–0.2)

## 2021-08-23 LAB — COMPREHENSIVE METABOLIC PANEL
ALT: 17 U/L (ref 0–44)
AST: 26 U/L (ref 15–41)
Albumin: 2 g/dL — ABNORMAL LOW (ref 3.5–5.0)
Alkaline Phosphatase: 132 U/L — ABNORMAL HIGH (ref 38–126)
Anion gap: 8 (ref 5–15)
BUN: 14 mg/dL (ref 6–20)
CO2: 22 mmol/L (ref 22–32)
Calcium: 8.3 mg/dL — ABNORMAL LOW (ref 8.9–10.3)
Chloride: 109 mmol/L (ref 98–111)
Creatinine, Ser: 1.16 mg/dL — ABNORMAL HIGH (ref 0.44–1.00)
GFR, Estimated: >60 mL/min (ref >60)
Glucose, Bld: 88 mg/dL (ref 70–99)
Potassium: 4 mmol/L (ref 3.5–5.1)
Sodium: 139 mmol/L (ref 135–145)
Total Bilirubin: 0.5 mg/dL (ref 0.3–1.2)
Total Protein: 5.1 g/dL — ABNORMAL LOW (ref 6.5–8.1)

## 2021-08-23 LAB — RUBELLA SCREEN: Rubella: 1.85 index (ref >0.99)

## 2021-08-23 MED ORDER — LABETALOL HCL 200 MG PO TABS
600.0000 mg | ORAL_TABLET | Freq: Three times a day (TID) | ORAL | Status: DC
Start: 2021-08-23 — End: 2021-08-24
  Administered 2021-08-23 – 2021-08-24 (×4): 600 mg via ORAL
  Filled 2021-08-23 (×4): qty 3

## 2021-08-23 MED ORDER — AMLODIPINE BESYLATE 5 MG PO TABS
10.0000 mg | ORAL_TABLET | Freq: Every day | ORAL | Status: DC
  Administered 2021-08-23 – 2021-08-24 (×2): 10 mg via ORAL
  Filled 2021-08-23 (×2): qty 2

## 2021-08-23 MED ORDER — SODIUM CHLORIDE 0.9 % IV SOLN
500.0000 mg | Freq: Once | INTRAVENOUS | Status: AC
  Administered 2021-08-23: 500 mg via INTRAVENOUS
  Filled 2021-08-23: qty 25

## 2021-08-23 NOTE — Progress Notes (Signed)
At 0006, this RN walked into the patient's room and observed Ms. Raimer sitting on the couch holding her infant, Boy Nurse, children's. Ms. Gulden proceeded to tell this RN she woke about ~15 minutes prior to this RN's arrival to the room to her infant on the floor beside her bed. This RN immediately assessed the infant (see assessment in flowsheets). Vital signs WNL, infant w/ vigorous cry, NIPS Score of 4. This RN educated Ms. Littleton on the ABCs of safe sleep and not to co-sleep with her infant to prevent infant drops and reduce the risk of SIDS. Ms. Freeman verbalized understanding. ?  ?Newborn nursery notified and Dr. Barney Drain (pediatrician) paged at [phone]. ?  ?Dr. Barney Drain paged at 0102. Dr. Barney Drain called unit to speak with this RN at 0104- no new orders received.  ?  ?Harl Bowie, RN  ?

## 2021-08-23 NOTE — Progress Notes (Addendum)
POSTPARTUM PROGRESS NOTE ? ?POD #2 ? ?Subjective: ? ?Kenzleigh RUSSIE GULLEDGE is a 26 y.o. G1P1001 s/p primary LTCS at [redacted]w[redacted]d.  She denies any problems with ambulating, voiding or po intake. Denies nausea or vomiting. She has  passed flatus. Pain is well controlled.  Lochia is normal. Pt is tolerating regular diet.  Patient denies any headaches, visual symptoms, RUQ/epigastric pain or other concerning symptoms. ? ?Of note, patient had an unfortunate episode where she dropped the baby while sleeping last night, baby is stable. ? ? ?Objective: ?Patient Vitals for the past 24 hrs: ? BP Temp Temp src Pulse Resp SpO2  ?08/23/21 0533 (!) 143/73 98.5 ?F (36.9 ?C) Oral 91 18 99 %  ?08/23/21 0058 (!) 141/81 98.6 ?F (37 ?C) Oral 89 18 96 %  ?08/22/21 1951 140/78 97.9 ?F (36.6 ?C) Oral 93 16 96 %  ?08/22/21 1517 138/81 98.6 ?F (37 ?C) Oral 93 15 96 %  ?08/22/21 1221 (!) 116/56 98 ?F (36.7 ?C) Oral 89 16 94 %  ?08/22/21 0748 117/62 98.1 ?F (36.7 ?C) Oral 90 17 95 %  ? ? ?Physical Exam:  ?General: alert, cooperative and no distress ?Chest: no respiratory distress ?Heart:regular rate, distal pulses intact ?Abdomen: soft, nontender, excellent bowel sounds ?Uterine Fundus: firm, appropriately tender ?DVT Evaluation: No calf swelling or tenderness ?Extremities: 1+ bilateral LE edema ?Skin: warm, dry; incision clean/dry/intact w/ pressure dressing dressing in place ? ?Results: ? ?  Latest Ref Rng & Units 08/22/2021  ?  4:46 AM 08/21/2021  ?  6:24 AM 08/21/2021  ? 12:12 AM  ?CBC  ?WBC 4.0 - 10.5 K/uL 18.1   27.4   23.8    ?Hemoglobin 12.0 - 15.0 g/dL 8.5   72.6   20.3    ?Hematocrit 36.0 - 46.0 % 25.2   32.6   36.6    ?Platelets 150 - 400 K/uL 196   231   247    ? ? ? ?  Latest Ref Rng & Units 08/22/2021  ?  4:46 AM 08/21/2021  ?  6:24 AM 08/21/2021  ? 12:12 AM  ?CMP  ?Glucose 70 - 99 mg/dL 96   559   741    ?BUN 6 - 20 mg/dL 15   13   11     ?Creatinine 0.44 - 1.00 mg/dL   6.38   4.53    ?Sodium 135 - 145 mmol/L 134   130   130    ?Potassium  3.5 - 5.1 mmol/L 3.9   4.6   4.2    ?Chloride 98 - 111 mmol/L 105   101   103    ?CO2 22 - 32 mmol/L 21   19   17     ?Calcium 8.9 - 10.3 mg/dL 8.2   8.5   9.0    ?Total Protein 6.5 - 8.1 g/dL 4.8   5.3   6.0    ?Total Bilirubin 0.3 - 1.2 mg/dL 0.1   0.7   0.5    ?Alkaline Phos 38 - 126 U/L 134   183   200    ?AST 15 - 41 U/L 31   28   27     ?ALT 0 - 44 U/L 17   16   18     ?  ? ?Intake/Output Summary (Last 24 hours) at 08/23/2021 0605 ?Last data filed at 08/23/2021 0550 ?Gross per 24 hour  ?Intake 960 ml  ?Output 3250 ml  ?Net -2290 ml  ?  ? ?Assessment/Plan: ?Zyanne  CAITLYNNE HARBECK is a 26 y.o. G1P1001 s/p LTCS at [redacted]w[redacted]d for pre-eclampsia with severe features. ? ?POD#2 -  ?Feeding: bottle ?Counseled about Venofer for acute blood loss anemia ?Cr trending down, excellent UOP. Follow uo pending Cr today. ?Continue Lasix per protocol, on Labetalol 600 mg po tid ?Consider switching to/adding Procardia XL if further BP control is needed ?Continue routine postpartum care ? ? LOS: 4 days  ? ?Jaynie Collins, MD ?Faculty Attending, Center for [facility] ?08/23/2021, 6:05 AM  ? ?Addendum ?Pharmacy says patient was taking amlodipe 10 prior to pregnancy, bp today moderately elevated, will start amlodipine 10 ?Shonna Chock MD ?

## 2021-08-23 NOTE — Progress Notes (Signed)
Called to see patient for incision oozing by her nurse, Maralyn Sago.  ? ?Patient is resting comfortably in the bed.  ? ?Vitals:  ? 08/23/21 1716 08/23/21 2015  ?BP: 140/80 139/72  ?Pulse: 90 88  ?Resp: 20 20  ?Temp: 98.3 ?F (36.8 ?C) 98.2 ?F (36.8 ?C)  ?SpO2: 96% 98%  ? ?Gen: NAD ?CV: regular rate ?Pulm: normal respiratory effort ?GI: fundus firm ?Incision: each end of the incision was c/d/I. In the midline of the incision, there was a 1 cm opening that was oozing dark red blood that sometimes appeared serosanguinous. Incision probed with sterile qtip once op note reviewed and peritoneum noted to be closed. No collection noted on probing. Pt tolerated this well. Deep pressure applied with ABD for 5 minutes and gauze removed. Minimal oozing at this point. I applied new pressure dressing myself and recommend binder as well over the pressure dressing.  ? ? ?  Latest Ref Rng & Units 08/23/2021  ?  9:05 PM 08/22/2021  ?  4:46 AM 08/21/2021  ?  6:24 AM  ?CBC  ?WBC 4.0 - 10.5 K/uL 12.0   18.1   27.4    ?Hemoglobin 12.0 - 15.0 g/dL 8.7   8.5   19.6    ?Hematocrit 36.0 - 46.0 % 25.5   25.2   32.6    ?Platelets 150 - 400 K/uL 238   196   231    ? ? ?A/P: ?Discussed the other option of returning to the OR to see if active bleeding, but I think this would be low yield and we could try more significant pressure dressing which the patient wished to try. Pressure dressing applied as noted above. If still oozing tomorrow morning, may need to return to OR for cautery, but I suspect she will not need this. CBC rechecked at hgb stable at 8.7. She is s/p IV iron today.  ?

## 2021-08-24 ENCOUNTER — Other Ambulatory Visit (HOSPITAL_COMMUNITY): Payer: Self-pay

## 2021-08-24 MED ORDER — FUROSEMIDE 20 MG PO TABS
20.0000 mg | ORAL_TABLET | Freq: Two times a day (BID) | ORAL | 0 refills | Status: DC
  Filled 2021-08-24: qty 10, 5d supply, fill #0

## 2021-08-24 MED ORDER — LABETALOL HCL 300 MG PO TABS
600.0000 mg | ORAL_TABLET | Freq: Three times a day (TID) | ORAL | 1 refills | Status: DC
Start: 2021-08-24 — End: 2021-08-24
  Filled 2021-08-24: qty 90, 15d supply, fill #0

## 2021-08-24 MED ORDER — AMLODIPINE BESYLATE 10 MG PO TABS
10.0000 mg | ORAL_TABLET | Freq: Every day | ORAL | 1 refills | Status: DC
  Filled 2021-08-24: qty 30, 30d supply, fill #0

## 2021-08-24 MED ORDER — LABETALOL HCL 300 MG PO TABS
600.0000 mg | ORAL_TABLET | Freq: Three times a day (TID) | ORAL | 1 refills | Status: DC
  Filled 2021-08-24: qty 180, 30d supply, fill #0

## 2021-08-24 MED ORDER — IBUPROFEN 600 MG PO TABS
600.0000 mg | ORAL_TABLET | Freq: Four times a day (QID) | ORAL | 0 refills | Status: DC | PRN
  Filled 2021-08-24: qty 60, 15d supply, fill #0

## 2021-08-24 MED ORDER — ACETAMINOPHEN 500 MG PO TABS
1000.0000 mg | ORAL_TABLET | Freq: Four times a day (QID) | ORAL | 0 refills | Status: DC
Start: 2021-08-24 — End: 2021-09-23
  Filled 2021-08-24: qty 30, 4d supply, fill #0

## 2021-08-24 MED ORDER — OXYCODONE HCL 5 MG PO TABS
5.0000 mg | ORAL_TABLET | Freq: Four times a day (QID) | ORAL | 0 refills | Status: DC | PRN
  Filled 2021-08-24: qty 20, 3d supply, fill #0

## 2021-08-24 NOTE — Plan of Care (Signed)
?  Problem: Health Behavior/Discharge Planning: ?Goal: Ability to manage health-related needs will improve ?Outcome: Completed/Met ?  ?Problem: Clinical Measurements: ?Goal: Ability to maintain clinical measurements within normal limits will improve ?Outcome: Completed/Met ?Goal: Will remain free from infection ?Outcome: Completed/Met ?Goal: Diagnostic test results will improve ?Outcome: Completed/Met ?Goal: Cardiovascular complication will be avoided ?Outcome: Completed/Met ?  ?Problem: Pain Managment: ?Goal: General experience of comfort will improve ?Outcome: Completed/Met ?  ?Problem: Skin Integrity: ?Goal: Risk for impaired skin integrity will decrease ?Outcome: Completed/Met ?  ?

## 2021-08-24 NOTE — Progress Notes (Signed)
CSW acknowledged consult and completed chart review.  When CSW arrived to room 106 MOB and infant were discharge.  ? ?Blaine Hamper, MSW, LCSW ?Clinical Social Work ?[phone] ? ?

## 2021-08-24 NOTE — Lactation Note (Signed)
This note was copied from a baby's chart. ?Lactation Consultation Note ? ?Patient Name: Tara Chase ?Today's Date: 08/24/2021 ?Reason for consult: Initial assessment;Term ?Age:28 hours ? ? ?LC Note: ? ?Mother originally declined lactation services.  Three days ago a Advertising copywriter went to visit with mother and spoke to RN who also stated that mother declined lactation services.  No further visits attempted until this morning when she called for a visit. ? ?Explained to mother why she had not been previously seen.  Her preference is breast/formula, however, she remarked that she has had "a lot" going on and has been formula feeding.  She plans to start latching at home after discharge today. ? ?Reviewed breast feeding basics.  Baby had just consumed 30 mls of formula and was asleep in the visitor's arms.  Encouraged to feed on cue or at least 8-12 times/24 hours.  Explained that baby will probably need time to practice since he has not been to the breast and has had a lot of artificial nipples; encouragement provided.  Mother has a pump set up in the room but has only pumped a couple of times; no colostrum noted.  Discussed the importance of frequent and consistent latching and pumping to help with milk supply.  Mother does not see any colostrum drops with hand expression. ? ?Suggested mother make an appointment with a [facility] peer counselor for breast feeding assistance.  She has our OP phone number for any questions.  Mother also has a pump for home use and begin using it consistently.  RN updated. ? ? ?Maternal Data ?  ? ?Feeding ?Nipple Type: Slow - flow ? ?LATCH Score ?  ? ?  ? ?  ? ?  ? ?  ? ?  ? ? ?Lactation Tools Discussed/Used ?  ? ?Interventions ?Interventions: Breast feeding basics reviewed;Education ? ?Discharge ?Discharge Education: Outpatient recommendation;Engorgement and breast care [facility] peer counselor) ?Pump: Personal ?WIC Program: Yes (Mother to follow up) ? ?Consult Status ?Consult Status:  Complete ?Date: 08/24/21 ?Follow-up type: Call as needed ? ? ? ?Grizel Vesely R Malin Cervini ?08/24/2021, 11:11 AM ? ? ? ?

## 2021-08-26 ENCOUNTER — Encounter: Payer: Medicaid Other | Admitting: Family Medicine

## 2021-08-27 LAB — GLUCOSE, CAPILLARY
Glucose-Capillary: 141 mg/dL — ABNORMAL HIGH (ref 70–99)
Glucose-Capillary: 104 mg/dL — ABNORMAL HIGH (ref 70–99)

## 2021-08-31 ENCOUNTER — Telehealth (HOSPITAL_COMMUNITY): Payer: Self-pay | Admitting: *Deleted

## 2021-08-31 NOTE — Telephone Encounter (Signed)
Opened chart accidentally. ? ?Duffy Rhody, RN 08-31-2021 at 10:08a ?

## 2021-09-02 ENCOUNTER — Ambulatory Visit (INDEPENDENT_AMBULATORY_CARE_PROVIDER_SITE_OTHER): Payer: Medicaid Other | Admitting: Family Medicine

## 2021-09-02 VITALS — BP 119/73 | HR 87 | Ht 65.0 in | Wt 198.0 lb

## 2021-09-02 DIAGNOSIS — Z4889 Encounter for other specified surgical aftercare: Secondary | ICD-10-CM

## 2021-09-02 DIAGNOSIS — F53 Postpartum depression: Secondary | ICD-10-CM

## 2021-09-02 NOTE — Progress Notes (Signed)
? ?  Subjective:  ? ? Patient ID: SIAHNA TODISCO, female    DOB: February 01, 1996, 26 y.o.   MRN: [id number] ? ?HPI ? ?Seen for 2 week follow up after c/s. Having a little bit of draining from incision - no pus. Pain mild. No concerns. ? ?Review of Systems ? ?   ?Objective:  ? Physical Exam ?Vitals reviewed.  ?Constitutional:   ?   Appearance: Normal appearance.  ?Abdominal:  ?   General: Abdomen is flat. There is no distension.  ?   Palpations: Abdomen is soft.  ?   Tenderness: There is no abdominal tenderness.  ?   Comments: 57mm defect in the otherwise well healed pfannenstiel incision. Serosanguinous discharge. No erythema or purulent drainage.  ?Neurological:  ?   Mental Status: She is alert.  ? ? ?   ?Assessment & Plan:  ?1. Encounter for post surgical wound check ?Keep clean and dry. Open to air at home. Can use pad to absorb moisture when out. Should continue to heal up. Discussed reasons to call and be seen prior to appt in 2 weeks. ? ? ?

## 2021-09-02 NOTE — Progress Notes (Signed)
Patient is two weeks postpartum and presents for bp check and incision check. Tara Stammer RN ?

## 2021-09-02 NOTE — Addendum Note (Signed)
Addended by: Anell Barr on: 09/02/2021 10:46 AM ? ? Modules accepted: Orders ? ?

## 2021-09-03 ENCOUNTER — Encounter: Payer: Self-pay | Admitting: Family Medicine

## 2021-09-16 ENCOUNTER — Other Ambulatory Visit (HOSPITAL_COMMUNITY): Payer: Self-pay

## 2021-09-23 ENCOUNTER — Encounter: Payer: Self-pay | Admitting: Family Medicine

## 2021-09-23 ENCOUNTER — Ambulatory Visit (INDEPENDENT_AMBULATORY_CARE_PROVIDER_SITE_OTHER): Payer: Medicaid Other | Admitting: Family Medicine

## 2021-09-23 DIAGNOSIS — O119 Pre-existing hypertension with pre-eclampsia, unspecified trimester: Secondary | ICD-10-CM

## 2021-09-23 DIAGNOSIS — O24415 Gestational diabetes mellitus in pregnancy, controlled by oral hypoglycemic drugs: Secondary | ICD-10-CM

## 2021-09-23 DIAGNOSIS — R87612 Low grade squamous intraepithelial lesion on cytologic smear of cervix (LGSIL): Secondary | ICD-10-CM

## 2021-09-23 NOTE — Progress Notes (Signed)
Post Partum Visit Note  Tara Chase is a 26 y.o. G31P1001 female who presents for a postpartum visit. She is 4 weeks postpartum following a primary cesarean section.  I have fully reviewed the prenatal and intrapartum course. The delivery was at 39 gestational weeks.  Anesthesia: epidural. Postpartum course has been normal. Baby is doing well. Baby is feeding by bottle - Enfamil Gentle Ease . Bleeding staining only. Bowel function is normal. Bladder function is normal. Patient is not sexually active. Contraception method is IUD. Postpartum depression screening: negative.   The pregnancy intention screening data noted above was reviewed. Potential methods of contraception were discussed. The patient elected to proceed with No data recorded.   Edinburgh Postnatal Depression Scale - 09/23/21 1338       Edinburgh Postnatal Depression Scale:  In the Past 7 Days   I have been able to laugh and see the funny side of things. 0    I have looked forward with enjoyment to things. 0    I have blamed myself unnecessarily when things went wrong. 0    I have been anxious or worried for no good reason. 0    I have felt scared or panicky for no good reason. 0    Things have been getting on top of me. 0    I have been so unhappy that I have had difficulty sleeping. 0    I have felt sad or miserable. 0    I have been so unhappy that I have been crying. 0    The thought of harming myself has occurred to me. 0    Edinburgh Postnatal Depression Scale Total 0             Health Maintenance Due  Topic Date Due   COVID-19 Vaccine (1) Never done   URINE MICROALBUMIN  Never done   PAP SMEAR-Modifier  01/27/2019    The following portions of the patient's history were reviewed and updated as appropriate: allergies, current medications, past family history, past medical history, past social history, past surgical history, and problem list.  Review of Systems Pertinent items are noted in  HPI.  Objective:  BP 120/69   Pulse 84   Wt 195 lb (88.5 kg)   LMP 11/19/2020 (Exact Date)   Breastfeeding No   BMI 32.45 kg/m    General:  alert, cooperative, and no distress   Breasts:  not indicated  Lungs: clear to auscultation bilaterally  Heart:  regular rate and rhythm, S1, S2 normal, no murmur, click, rub or gallop  Abdomen: soft, non-tender; bowel sounds normal; no masses,  no organomegaly   Wound well approximated incision  GU exam:  not indicated       Assessment:     1. Postpartum care and examination   2. Chronic hypertension with superimposed pre-eclampsia   3. Low grade squamous intraepithelial lesion (LGSIL) on cervical Pap smear   4. Gestational diabetes mellitus (GDM) in third trimester controlled on oral hypoglycemic drug      Plan:   Essential components of care per ACOG recommendations:  1.  Mood and well being: Patient with negative depression screening today. Reviewed local resources for support.  - Patient tobacco use? No.   - hx of drug use? No.    2. Infant care and feeding:  -Patient currently breastmilk feeding? No.  -Social determinants of health (SDOH) reviewed in EPIC. No concerns  3. Sexuality, contraception and birth spacing - Patient does not  want a pregnancy in the next year.   - Reviewed reproductive life planning. Reviewed contraceptive methods based on pt preferences and effectiveness.  Patient desired IUD or IUS today.   - Discussed birth spacing of 18 months  4. Sleep and fatigue -Encouraged family/partner/community support of 4 hrs of uninterrupted sleep to help with mood and fatigue  5. Physical Recovery  - Discussed patients delivery and complications. She describes her labor as mixed. - Patient had a C-section failure to progress. Patient expressed understanding - Patient has urinary incontinence? No. - Patient is not safe to resume physical and sexual activity  6.  Health Maintenance - HM due items addressed No -  deferred for IUD insertion . Patient not sure of paragard vs Liletta. - Last pap smear No results found for: DIAGPAP Pap smear not done at today's visit.  -Breast Cancer screening indicated? No.   7. Chronic Disease/Pregnancy Condition follow up:  2 weeks for 2hr GTT. Will also do PAP and IUD insertion  - PCP follow up  Levie Heritage, DO Center for [employer], [facility]

## 2021-10-01 ENCOUNTER — Other Ambulatory Visit: Payer: Medicaid Other

## 2021-10-05 NOTE — BH Specialist Note (Signed)
Integrated Behavioral Health via Telemedicine Visit  10/18/2021 Tara Chase [id number]  Number of Integrated Behavioral Health Clinician visits: 1- Initial Visit  Session Start time: [id number]   Session End time: 1104  Total time in minutes: 48   Referring Provider: Loma [place], Do Patient/Family location: Home [facility] Provider location: Center for [place] [facility]  All persons participating in visit: Patient Tara Chase and [name]   Types of Service: Individual psychotherapy and Video visit  I connected with Tara Chase and/or Tara Chase's  n/a  via  Telephone or Video Enabled Telemedicine Application  (Video is Caregility application) and verified that I am speaking with the correct person using two identifiers. Discussed confidentiality: Yes   I discussed the limitations of telemedicine and the availability of in person appointments.  Discussed there is a possibility of technology failure and discussed alternative modes of communication if that failure occurs.  I discussed that engaging in this telemedicine visit, they consent to the provision of behavioral healthcare and the services will be billed under their insurance.  Patient and/or legal guardian expressed understanding and consented to Telemedicine visit: Yes   Presenting Concerns: Patient and/or family reports the following symptoms/concerns: Adjusting to new motherhood with fatigue, irritability and worry, financial stress and return to work soon; open to implementing self-coping strategy and receive community resources. Duration of problem: Perinatal; Severity of problem: moderate  Patient and/or Family's Strengths/Protective Factors: Social connections, Concrete supports in place (healthy food, safe environments, etc.), and Sense of purpose  Goals Addressed: Patient will:  Reduce symptoms of: anxiety, depression, and stress   Increase knowledge and/or  ability of: self-management skills and stress reduction   Demonstrate ability to: Increase healthy adjustment to current life circumstances and Increase adequate support systems for patient/family  Progress towards Goals: Ongoing  Interventions: Interventions utilized:  Mindfulness or Psychologist, educational, Psychoeducation and/or Health Education, and Link to [employer] Standardized Assessments completed: PHQ9, GAD7  Patient and/or Family Response: Patient agrees with treatment plan.   Assessment: Patient currently experiencing Adjustment disorder with depressed and anxious mood and Psychosocial stress.   Patient may benefit from psychoeducation and brief therapeutic interventions regarding coping with symptoms of depression, anxiety, life stress .  Plan: Follow up with behavioral health clinician on : Two weeks Behavioral recommendations:  -Continue taking prenatal vitamins as recommended by medical provider -CALM relaxation breathing exercise twice daily (morning; at bedtime with sleep sounds); as needed throughout the day. -Accept referral to [employer] -Consider additional community resources on After Visit Summary Referral(s): [employer] (In Clinic) and [employer] Resources:  Copy  I discussed the assessment and treatment plan with the patient and/or parent/guardian. They were provided an opportunity to ask questions and all were answered. They agreed with the plan and demonstrated an understanding of the instructions.   They were advised to call back or seek an in-person evaluation if the symptoms worsen or if the condition fails to improve as anticipated.  Garlan Fair, LCSW     10/18/2021   10:23 AM 04/28/2021   11:44 AM 03/10/2021   10:26 AM  Depression screen PHQ 2/9  Decreased Interest 1 0 0  Down, Depressed, Hopeless 1 0 0  PHQ - 2 Score 2 0 0  Altered sleeping 0  1  Tired, decreased energy 3  1  Change in  appetite 0  0  Feeling bad or failure about yourself  0  0  Trouble concentrating 0  0  Moving slowly or fidgety/restless 0  0  Suicidal thoughts 0  0  PHQ-9 Score 5  2      10/18/2021   10:26 AM 03/10/2021   10:27 AM  GAD 7 : Generalized Anxiety Score  Nervous, Anxious, on Edge 1 1  Control/stop worrying 0 0  Worry too much - different things 2 1  Trouble relaxing 0 0  Restless 0 0  Easily annoyed or irritable 3 0  Afraid - awful might happen 0 0  Total GAD 7 Score 6 2

## 2021-10-06 ENCOUNTER — Other Ambulatory Visit (HOSPITAL_COMMUNITY)
Admission: RE | Admit: 2021-10-06 | Discharge: 2021-10-06 | Disposition: A | Discharge status: Home / Self Care | Payer: Medicaid Other | Source: Ambulatory Visit | Attending: Family Medicine | Admitting: Family Medicine

## 2021-10-06 ENCOUNTER — Encounter: Payer: Self-pay | Admitting: Family Medicine

## 2021-10-06 ENCOUNTER — Ambulatory Visit (INDEPENDENT_AMBULATORY_CARE_PROVIDER_SITE_OTHER): Payer: 59 | Admitting: Family Medicine

## 2021-10-06 VITALS — BP 121/86 | HR 91 | Wt 194.0 lb

## 2021-10-06 DIAGNOSIS — R87612 Low grade squamous intraepithelial lesion on cytologic smear of cervix (LGSIL): Secondary | ICD-10-CM

## 2021-10-06 DIAGNOSIS — Z30430 Encounter for insertion of intrauterine contraceptive device: Secondary | ICD-10-CM

## 2021-10-06 DIAGNOSIS — Z8632 Personal history of gestational diabetes: Secondary | ICD-10-CM

## 2021-10-06 MED ORDER — LEVONORGESTREL 20.1 MCG/DAY IU IUD
1.0000 | INTRAUTERINE_SYSTEM | Freq: Once | INTRAUTERINE | Status: AC
  Administered 2021-10-06: 1 via INTRAUTERINE

## 2021-10-06 NOTE — Progress Notes (Signed)
PAP done for history of abnormal PAP Patient started to breastfeed again - galactagogue handout given.  IUD Procedure Note Patient identified, informed consent performed, signed copy in chart, time out was performed.  Urine pregnancy test negative.  Speculum placed in the vagina.  Cervix visualized.  Cleaned with Betadine x 2.  Paracervical block placed with Lidocaine 2% with epinephrine 71mL spread between the 12 o'clock. Cervix grasped anteriorly with a single tooth tenaculum.  Uterus sounded to 7 cm.  Liletta  IUD placed per manufacturer's recommendations.  Strings trimmed to 3 cm. Tenaculum was removed, good hemostasis noted.  Patient tolerated procedure well.   Patient given post procedure instructions and Liletta care card with expiration date.  Patient is asked to check IUD strings periodically and follow up in 4-6 weeks for IUD check.

## 2021-10-06 NOTE — Progress Notes (Signed)
Patient would like to try and have a milk supply again. She has pumped last few days and gotten a little out. Armandina Stammer RN

## 2021-10-06 NOTE — Patient Instructions (Signed)
  For Breastfeeding:   NOTHING WILL WORK UNLESS YOU ARE  - Emptying the breast adequately/completely - Emptying the breast regularly  Supplement/Herb Purpose Dose Side Effects  Vitamin D Increase Vitamin D to infant, recommended for all breastfeeding moms and can use instead of supplementing infant 4000 IU daily None  Fenugreek** use with extreme caution as this can decrease milk supply in some  Increases prolactin  400mg  TID (max) Nausea, Loose stools and smelling like maple syrup  Goat's Rue Help increase differentiation of breast tissue, good for women with suspected IGT. Helps with insulin sensitvity 1 capsule BID Nausea, loose stools  Legendairy Milk Supplements -PumpPrincess -Liquid Gold -Cash Cow -Milkapalooza  Combination of herbal galactogues  Per packaging Per packaging  Lactation cookies/bars Food based galactogues/increase maternal calories All the time, q2-3 hours    Flax seeds Food based Galactogue Daily GI upset, nausea  Brewer's yeast Food based Galactogue Daily GI upset, nausea  Hemp Hearts Food based Galactogue Daily GI upset, nausea  Oats Food based Galactogue Daily   Lecithin (Soy or Sunflower) Decreases the viscosity of milk, galactogue, fat emulsifier  Good for oversupply mothers to prevent clogged ducts  GI upset  Rehydration drinks (Gatorade/Powerade) Improves maternal hydration and mammary glands are histologically similar to sweat glands  None, caution use in patients with T2DM

## 2021-10-07 LAB — GLUCOSE TOLERANCE, 2 HOURS
Glucose, 2 hour: 90 mg/dL (ref 70–139)
Glucose, GTT - Fasting: 126 mg/dL — ABNORMAL HIGH (ref 70–99)

## 2021-10-11 ENCOUNTER — Encounter: Payer: Self-pay | Admitting: Family Medicine

## 2021-10-11 LAB — CYTOLOGY - PAP
Comment: NEGATIVE
Diagnosis: UNDETERMINED — AB
High risk HPV: NEGATIVE

## 2021-10-18 ENCOUNTER — Ambulatory Visit (INDEPENDENT_AMBULATORY_CARE_PROVIDER_SITE_OTHER): Payer: 59 | Admitting: Clinical

## 2021-10-18 DIAGNOSIS — F4323 Adjustment disorder with mixed anxiety and depressed mood: Secondary | ICD-10-CM | POA: Diagnosis not present

## 2021-10-18 DIAGNOSIS — Z658 Other specified problems related to psychosocial circumstances: Secondary | ICD-10-CM

## 2021-10-18 NOTE — Patient Instructions (Signed)
Center for [facility] for [facility] [address] [place], [place] [address] [phone] (main office) [phone] [facility] office)  New Parent Support Groups www.postpartum.net www.conehealthybaby.com   [place] Copy  (Childcare options, Early childcare development, etc.) DietDisorder.cz  LIEAP (Low Income Energy Assistance Program) LowBlog.nl  [employer] (Low Income Public house manager Program) LittleDVDs.dk /Emotional Wellbeing Apps and Websites Here are a few free apps meant to help you to help yourself.  To find, try searching on the internet to see if the app is offered on Apple/Android devices. If your first choice doesn't come up on your device, the good news is that there are many choices! Play around with different apps to see which ones are helpful to you.    Calm This is an app meant to help increase calm feelings. Includes info, strategies, and tools for tracking your feelings.      Calm Harm  This app is meant to help with self-harm. Provides many 5-minute or 15-min coping strategies for doing instead of hurting yourself.       Healthy Minds Health Minds is a problem-solving tool to help deal with emotions and cope with stress you encounter wherever you are.      MindShift This app can help people cope with anxiety. Rather than trying to avoid anxiety, you can make an important shift and face it.      MY3  MY3 features a support system, safety plan and resources with the goal of offering a tool to use in a time of need.       My Life My Voice  This mood journal offers a simple solution for tracking your thoughts, feelings and moods. Animated emoticons can help identify your mood.       Relax  Melodies Designed to help with sleep, on this app you can mix sounds and meditations for relaxation.      Smiling Mind Smiling Mind is meditation made easy: it's a simple tool that helps put a smile on your mind.        Stop, Breathe & Think  A friendly, simple guide for people through meditations for mindfulness and compassion.  Stop, Breathe and Think Kids Enter your current feelings and choose a "mission" to help you cope. Offers videos for certain moods instead of just sound recordings.       Team Orange The goal of this tool is to help teens change how they think, act, and react. This app helps you focus on your own good feelings and experiences.      The United Station[employer] Box The [employer] Station[employer] Box (VHB) contains simple tools to help patients with coping, relaxation, distraction, and positive thinking.

## 2021-10-18 NOTE — Addendum Note (Signed)
Addended by: Hulda Marin C on: 10/18/2021 01:11 PM   Modules accepted: Orders

## 2021-10-19 ENCOUNTER — Encounter: Payer: Self-pay | Admitting: General Practice

## 2021-10-20 NOTE — BH Specialist Note (Signed)
Integrated Behavioral Health via Telemedicine Visit  11/03/2021 Tara Chase [id number]  Number of Integrated Behavioral Health Clinician visits: 2- Second Visit  Session Start time: 0945   Session End time: 1007  Total time in minutes: 22   Referring Provider: Candelaria Celeste, DO Patient/Family location: Home [facility] Provider location: [facility]  All persons participating in visit: Patient Tara Chase and Tara Chase   Types of Service: Individual psychotherapy  I connected with Tara Chase and/or Tara Chase's  n/a  via  Telephone or Video Enabled Telemedicine Application  (Video is Caregility application) and verified that I am speaking with the correct person using two identifiers. Discussed confidentiality: Yes   I discussed the limitations of telemedicine and the availability of in person appointments.  Discussed there is a possibility of technology failure and discussed alternative modes of communication if that failure occurs.  I discussed that engaging in this telemedicine visit, they consent to the provision of behavioral healthcare and the services will be billed under their insurance.  Patient and/or legal guardian expressed understanding and consented to Telemedicine visit: Yes   Presenting Concerns: Patient and/or family reports the following symptoms/concerns: Stress of temporary move into family home after air conditioning stopped working; back to work since Friday.  Duration of problem: Postpartum; Severity of problem: moderate  Patient and/or Family's Strengths/Protective Factors: Social connections, Concrete supports in place (healthy food, safe environments, etc.), and Sense of purpose  Goals Addressed: Patient will:  Reduce symptoms of: anxiety, depression, and stress   Increase knowledge and/or ability of: stress reduction   Demonstrate ability to: Increase healthy adjustment to  current life circumstances  Progress towards Goals: Ongoing  Interventions: Interventions utilized:  Link to [employer] and Supportive Reflection Standardized Assessments completed: GAD-7 and PHQ 9  Patient and/or Family Response: Patient agrees with treatment plan.   Assessment: Patient currently experiencing Adjustment disorder with mixed anxious and depressed mood and Psychosocial stress.   Patient may benefit from continued therapeutic interventions.  Plan: Follow up with behavioral health clinician on : Two weeks Behavioral recommendations:  -Continue using relaxation breathing exercises as needed daily -Continue accept referral to [employer] -Consider additional community resources that may be helpful with heat/air repairs (call each agency to find if aid is available) Referral(s): Integrated Art gallery manager (In Clinic) and [employer] Resources:  utility repairs  I discussed the assessment and treatment plan with the patient and/or parent/guardian. They were provided an opportunity to ask questions and all were answered. They agreed with the plan and demonstrated an understanding of the instructions.   They were advised to call back or seek an in-person evaluation if the symptoms worsen or if the condition fails to improve as anticipated.  Rae Lips, LCSW     11/03/2021    9:56 AM 10/18/2021   10:23 AM 04/28/2021   11:44 AM 03/10/2021   10:26 AM  Depression screen PHQ 2/9  Decreased Interest 0 1 0 0  Down, Depressed, Hopeless 1 1 0 0  PHQ - 2 Score 1 2 0 0  Altered sleeping 0 0  1  Tired, decreased energy 1 3  1   Change in appetite 0 0  0  Feeling bad or failure about yourself  0 0  0  Trouble concentrating 0 0  0  Moving slowly or fidgety/restless 0 0  0  Suicidal thoughts 0 0  0  PHQ-9 Score 2 5  2      11/03/2021    9:58 AM 10/18/2021   10:26 AM 03/10/2021   10:27 AM  GAD 7 : Generalized Anxiety Score  Nervous, Anxious, on Edge 1 1  1   Control/stop worrying 0 0 0  Worry too much - different things 3 2 1   Trouble relaxing 1 0 0  Restless 0 0 0  Easily annoyed or irritable 1 3 0  Afraid - awful might happen 0 0 0  Total GAD 7 Score 6 6 2

## 2021-11-03 ENCOUNTER — Ambulatory Visit: Payer: 59 | Admitting: Clinical

## 2021-11-03 DIAGNOSIS — Z658 Other specified problems related to psychosocial circumstances: Secondary | ICD-10-CM

## 2021-11-03 DIAGNOSIS — F4323 Adjustment disorder with mixed anxiety and depressed mood: Secondary | ICD-10-CM

## 2021-11-03 NOTE — Patient Instructions (Addendum)
Home Repair Resources                    [employer] (serves [place], [place], [place], [place], [place], [place], [place], [place], [place], [place], [place], [place], and [place] counties) [address] [place], [place] [address] [phone] [email or web address]  **Rental assistance, Home Rehabilitation,Weatherization Assistance Program, Chief Financial Officer, Housing Voucher Program  [employer] [address], [place], [place] [address] [phone] https://chshousing.org **Home Ownership/Affordable Housing Program and Home Repair Program  [facility] [address] [place], [place] [address] [phone] ShowReturn.ca **Home Expense Assistance/Repairs Program; offers home accessibility updates, such as ramps or bars in the bathroom

## 2021-11-04 ENCOUNTER — Ambulatory Visit (INDEPENDENT_AMBULATORY_CARE_PROVIDER_SITE_OTHER): Payer: Medicaid Other | Admitting: Family Medicine

## 2021-11-04 VITALS — BP 133/85 | HR 74 | Wt 193.0 lb

## 2021-11-04 DIAGNOSIS — L501 Idiopathic urticaria: Secondary | ICD-10-CM | POA: Diagnosis not present

## 2021-11-04 DIAGNOSIS — Z30431 Encounter for routine checking of intrauterine contraceptive device: Secondary | ICD-10-CM

## 2021-11-04 NOTE — Progress Notes (Signed)
   Subjective:   Patient Name: Tara Chase, female   DOB: 02-Jan-1996, 26 y.o.  MRN: [id number]  HPI Patient here for an IUD check.  She had the Liletta IUD placed 1 month ago.  She reports bleeding almost every day since IUD insertion. Did have some days of spotting, but just 1-2 days of no bleeding. Mild cramping.  Also notes having episodes of itching. Develops hives when she scratches. Start a week or two before having the baby, but has continued afterwards.   Review of Systems  Constitutional: Negative for fever and chills.  Gastrointestinal: Negative for abdominal pain.  Genitourinary: Negative for vaginal discharge, vaginal pain, pelvic pain and dyspareunia.        Objective:   Physical Exam  Constitutional: She appears well-developed and well-nourished.  HENT:  Head: Normocephalic and atraumatic.  Abdominal: Soft. There is no tenderness. There is no guarding.  Genitourinary: There is no rash, tenderness or lesion on the right labia. There is no rash, tenderness or lesion on the left labia. No erythema or tenderness in the vagina. No foreign body around the vagina. No signs of injury around the vagina. No vaginal discharge found.    Skin: Skin is warm and dry.  Psychiatric: She has a normal mood and affect. Her behavior is normal. Judgment and thought content normal.       Assessment & Plan:  1. IUD check up IUD in place.  Offered trial of progesterone tablets to take for a month. Patient wanted to see how things progressed over this next week as she thinks that it is lightening.  Recheck in 1 year.  2. Idiopathic urticaria Recommended antihistamines. If not improving, patient to see PCP.

## 2021-11-12 ENCOUNTER — Encounter: Payer: Self-pay | Admitting: Family Medicine

## 2021-11-17 ENCOUNTER — Other Ambulatory Visit: Payer: Self-pay | Admitting: Family Medicine

## 2021-11-17 MED ORDER — MEGESTROL ACETATE 40 MG PO TABS: 40.0000 mg | ORAL_TABLET | Freq: Every day | ORAL | 0 refills | Status: DC

## 2022-02-07 ENCOUNTER — Ambulatory Visit (INDEPENDENT_AMBULATORY_CARE_PROVIDER_SITE_OTHER): Payer: Medicaid Other | Admitting: Obstetrics & Gynecology

## 2022-02-07 ENCOUNTER — Encounter: Payer: Self-pay | Admitting: Obstetrics & Gynecology

## 2022-02-07 ENCOUNTER — Other Ambulatory Visit (HOSPITAL_COMMUNITY)
Admission: RE | Admit: 2022-02-07 | Discharge: 2022-02-07 | Disposition: A | Discharge status: Home / Self Care | Payer: Medicaid Other | Source: Ambulatory Visit | Attending: Obstetrics & Gynecology | Admitting: Obstetrics & Gynecology

## 2022-02-07 VITALS — BP 144/92 | HR 92 | Wt 192.0 lb

## 2022-02-07 DIAGNOSIS — B9689 Other specified bacterial agents as the cause of diseases classified elsewhere: Secondary | ICD-10-CM

## 2022-02-07 DIAGNOSIS — N760 Acute vaginitis: Secondary | ICD-10-CM

## 2022-02-07 DIAGNOSIS — N898 Other specified noninflammatory disorders of vagina: Secondary | ICD-10-CM

## 2022-02-07 DIAGNOSIS — B3731 Acute candidiasis of vulva and vagina: Secondary | ICD-10-CM

## 2022-02-07 NOTE — Progress Notes (Signed)
   GYNECOLOGY OFFICE VISIT NOTE  History:   Tara Chase is a 26 y.o. G1P1001 here today for evaluation of vaginal odor for a few days.  Her period just started, and is very heavy this morning, she is deferring examination.. She denies any abnormal vaginal discharge, bleeding, pelvic pain or other concerns.    Past Medical History:  Diagnosis Date   Asthma    Gestational diabetes    Hypertension     Past Surgical History:  Procedure Laterality Date   CESAREAN SECTION N/A 08/21/2021   Procedure: CESAREAN SECTION;  Surgeon: Woodroe Mode, MD;  Location: [facility] LD ORS;  Service: Obstetrics;  Laterality: N/A;   NO PAST SURGERIES      The following portions of the patient's history were reviewed and updated as appropriate: allergies, current medications, past family history, past medical history, past social history, past surgical history and problem list.   Health Maintenance:  ASCUS pap and negative HRHPV on 10/06/2021.   Review of Systems:  Pertinent items noted in HPI and remainder of comprehensive ROS otherwise negative.  Physical Exam:  BP (!) 144/92   Pulse 92   Wt 192 lb (87.1 kg)   BMI 31.95 kg/m  Deferred     Assessment and Plan:    1. Vaginal odor Self swab done, patient not evaluated by physician, only evaluated by RN but plan was discussed with me. - Cervicovaginal ancillary only( [place]) Will follow up results and manage accordingly. Information about proper vulvar hygiene and vaginitis given to patient.  Return for any gynecologic concerns.     Verita Schneiders, MD, [place] for [employer], [place]

## 2022-02-08 LAB — CERVICOVAGINAL ANCILLARY ONLY
Bacterial Vaginitis (gardnerella): POSITIVE — AB
Candida Glabrata: NEGATIVE
Candida Vaginitis: POSITIVE — AB
Chlamydia: NEGATIVE
Comment: NEGATIVE
Comment: NEGATIVE
Comment: NEGATIVE
Comment: NEGATIVE
Comment: NEGATIVE
Comment: NORMAL
Neisseria Gonorrhea: NEGATIVE
Trichomonas: NEGATIVE

## 2022-02-08 MED ORDER — FLUCONAZOLE 150 MG PO TABS: 150.0000 mg | ORAL_TABLET | Freq: Once | ORAL | 3 refills | Status: AC

## 2022-02-08 MED ORDER — METRONIDAZOLE 500 MG PO TABS: 500.0000 mg | ORAL_TABLET | Freq: Two times a day (BID) | ORAL | 0 refills | Status: AC

## 2022-02-08 NOTE — Addendum Note (Signed)
Addended by: Verita Schneiders A on: 02/08/2022 01:22 PM   Modules accepted: Orders

## 2022-03-02 ENCOUNTER — Encounter: Payer: Self-pay | Admitting: Family Medicine

## 2022-03-03 ENCOUNTER — Other Ambulatory Visit: Payer: Self-pay

## 2022-03-03 MED ORDER — METRONIDAZOLE 500 MG PO TABS: 500.0000 mg | ORAL_TABLET | Freq: Two times a day (BID) | ORAL | 0 refills | Status: DC

## 2022-07-18 ENCOUNTER — Ambulatory Visit (HOSPITAL_COMMUNITY)
Admission: EM | Admit: 2022-07-18 | Discharge: 2022-07-18 | Disposition: A | Discharge status: Home / Self Care | Payer: Managed Care, Other (non HMO) | Attending: Family Medicine | Admitting: Family Medicine

## 2022-07-18 ENCOUNTER — Encounter (HOSPITAL_COMMUNITY): Payer: Self-pay

## 2022-07-18 DIAGNOSIS — J111 Influenza due to unidentified influenza virus with other respiratory manifestations: Secondary | ICD-10-CM | POA: Diagnosis present

## 2022-07-18 DIAGNOSIS — R059 Cough, unspecified: Secondary | ICD-10-CM | POA: Insufficient documentation

## 2022-07-18 DIAGNOSIS — J069 Acute upper respiratory infection, unspecified: Secondary | ICD-10-CM | POA: Diagnosis present

## 2022-07-18 DIAGNOSIS — Z1152 Encounter for screening for COVID-19: Secondary | ICD-10-CM | POA: Insufficient documentation

## 2022-07-18 MED ORDER — BENZONATATE 100 MG PO CAPS: 100.0000 mg | ORAL_CAPSULE | Freq: Three times a day (TID) | ORAL | 0 refills | Status: DC | PRN

## 2022-07-18 MED ORDER — OSELTAMIVIR PHOSPHATE 75 MG PO CAPS: 75.0000 mg | ORAL_CAPSULE | Freq: Two times a day (BID) | ORAL | 0 refills | Status: DC

## 2022-07-18 NOTE — ED Triage Notes (Signed)
Pt is here for chills, body aches, nasal congestion, headache , sore throat, runny nose, low energy, loss of appetite, cough. Pt took [place] but, it did not help as per pt.

## 2022-07-18 NOTE — Discharge Instructions (Signed)
Take benzonatate 100 mg, 1 tab every 8 hours as needed for cough.  Take oseltamivir 75 mg--1 capsule 2 times daily for 5 days; this is the antiviral medicine for possible flu illness   You have been swabbed for COVID, and the test will result in the next 24 hours. Our staff will call you if positive. If the COVID test is positive, you should quarantine until you are fever free for 24 hours and you are starting to feel better, and then take added precautions for the next 5 days, such as physical distancing/wearing a mask and good hand hygiene/washing.  If the COVID test is positive, you will stop the oseltamivir.

## 2022-07-18 NOTE — ED Provider Notes (Signed)
[place]    CSN: [id number] Arrival date & time: 07/18/22  1819      History   Chief Complaint Chief Complaint  Patient presents with   Cough   Headache    HPI Tara Chase is a 27 y.o. female.    Cough Associated symptoms: headaches   Headache Associated symptoms: cough    Here for fever and congestion and cough.  Symptoms began yesterday and got worse today.  She has had chills but no nausea or vomiting or diarrhea.  She does have a history of asthma but has not bothered her since this began.  Past medical history significant for hypertension.  Past Medical History:  Diagnosis Date   Asthma    Gestational diabetes    Hypertension     Patient Active Problem List   Diagnosis Date Noted   Low grade squamous intraepithelial lesion (LGSIL) on cervical Pap smear 02/02/2016    Past Surgical History:  Procedure Laterality Date   CESAREAN SECTION N/A 08/21/2021   Procedure: CESAREAN SECTION;  Surgeon: Woodroe Mode, MD;  Location: [facility];  Service: Obstetrics;  Laterality: N/A;   NO PAST SURGERIES      OB History     Gravida  1   Para  1   Term  1   Preterm  0   AB  0   Living  1      SAB  0   IAB  0   Ectopic  0   Multiple  0   Live Births  1            Home Medications    Prior to Admission medications   Medication Sig Start Date End Date Taking? Authorizing Provider  amLODipine (NORVASC) 10 MG tablet Take 1 tablet (10 mg total) by mouth daily. 08/24/21  Yes Aletha Halim, MD  benzonatate (TESSALON) 100 MG capsule Take 1 capsule (100 mg total) by mouth 3 (three) times daily as needed for cough. 07/18/22  Yes Barrett Henle, MD  oseltamivir (TAMIFLU) 75 MG capsule Take 1 capsule (75 mg total) by mouth every 12 (twelve) hours. 07/18/22  Yes Barrett Henle, MD  labetalol (NORMODYNE) 300 MG tablet Take 2 tablets (600 mg total) by mouth 3 (three) times daily. 08/24/21   Aletha Halim, MD  megestrol  (MEGACE) 40 MG tablet Take 1 tablet (40 mg total) by mouth daily. Patient not taking: Reported on 02/07/2022 11/17/21   Clarnce Flock, MD    Family History Family History  Problem Relation Age of Onset   Hypertension Mother    Hypertension Father    Stroke Father    Cancer Maternal Grandfather        prostate   Cancer Paternal Grandfather        colon    Social History Social History   Tobacco Use   Smoking status: Never   Smokeless tobacco: Never  Vaping Use   Vaping Use: Never used  Substance Use Topics   Alcohol use: Not Currently   Drug use: No     Allergies   Pollen extract   Review of Systems Review of Systems  Respiratory:  Positive for cough.   Neurological:  Positive for headaches.     Physical Exam Triage Vital Signs ED Triage Vitals  Enc Vitals Group     BP 07/18/22 1919 134/81     Pulse Rate 07/18/22 1919 (!) 126     Resp 07/18/22 1919  16     Temp 07/18/22 1919 (!) 101.7 F (38.7 C)     Temp Source 07/18/22 1919 Oral     SpO2 07/18/22 1919 96 %     Weight --      Height --      Head Circumference --      Peak Flow --      Pain Score 07/18/22 1917 9     Pain Loc --      Pain Edu? --      Excl. in [place]? --    No data found.  Updated Vital Signs BP 134/81 (BP Location: Left Arm)   Pulse (!) 126   Temp (!) 101.7 F (38.7 C) (Oral)   Resp 16   LMP  (LMP Unknown)   SpO2 96%   Visual Acuity Right Eye Distance:   Left Eye Distance:   Bilateral Distance:    Right Eye Near:   Left Eye Near:    Bilateral Near:     Physical Exam Vitals reviewed.  Constitutional:      General: She is not in acute distress.    Appearance: She is not toxic-appearing.  HENT:     Nose: Congestion and rhinorrhea present.     Mouth/Throat:     Mouth: Mucous membranes are moist.     Comments: There is clear mucus draining in the oropharynx. Eyes:     Extraocular Movements: Extraocular movements intact.     Conjunctiva/sclera: Conjunctivae normal.      Pupils: Pupils are equal, round, and reactive to light.  Cardiovascular:     Rate and Rhythm: Normal rate and regular rhythm.     Heart sounds: No murmur heard. Pulmonary:     Effort: Pulmonary effort is normal. No respiratory distress.     Breath sounds: No stridor. No wheezing, rhonchi or rales.  Musculoskeletal:     Cervical back: Neck supple.  Lymphadenopathy:     Cervical: No cervical adenopathy.  Skin:    Capillary Refill: Capillary refill takes less than 2 seconds.     Coloration: Skin is not jaundiced or pale.  Neurological:     General: No focal deficit present.     Mental Status: She is alert and oriented to person, place, and time.  Psychiatric:        Behavior: Behavior normal.      UC Treatments / Results  Labs (all labs ordered are listed, but only abnormal results are displayed) Labs Reviewed  SARS CORONAVIRUS 2 (TAT 6-24 HRS)    EKG   Radiology No results found.  Procedures Procedures (including critical care time)  Medications Ordered in UC Medications - No data to display  Initial Impression / Assessment and Plan / UC Course  I have reviewed the triage vital signs and the nursing notes.  Pertinent labs & imaging results that were available during my care of the patient were reviewed by me and considered in my medical decision making (see chart for details).        I am going to treat for influenza-like illness with Tamiflu.  COVID swab is done, and she will stop the Tamiflu if positive for COVID.  We do not have the PCR flu test kits to perform that test for her today. If she is positive for COVID she is a candidate for Paxlovid.  Her last EGFR was 91 in October 2023 in care everywhere.  She has an albuterol inhaler to use at home if she needs it.  Final Clinical Impressions(s) / UC Diagnoses   Final diagnoses:  Viral URI with cough  Influenza-like illness     Discharge Instructions      Take benzonatate 100 mg, 1 tab every  8 hours as needed for cough.  Take oseltamivir 75 mg--1 capsule 2 times daily for 5 days; this is the antiviral medicine for possible flu illness   You have been swabbed for COVID, and the test will result in the next 24 hours. Our staff will call you if positive. If the COVID test is positive, you should quarantine until you are fever free for 24 hours and you are starting to feel better, and then take added precautions for the next 5 days, such as physical distancing/wearing a mask and good hand hygiene/washing.  If the COVID test is positive, you will stop the oseltamivir.       ED Prescriptions     Medication Sig Dispense Auth. Provider   benzonatate (TESSALON) 100 MG capsule Take 1 capsule (100 mg total) by mouth 3 (three) times daily as needed for cough. 21 capsule Barrett Henle, MD   oseltamivir (TAMIFLU) 75 MG capsule Take 1 capsule (75 mg total) by mouth every 12 (twelve) hours. 10 capsule Barrett Henle, MD      PDMP not reviewed this encounter.   Barrett Henle, MD 07/18/22 [phone]

## 2022-07-19 LAB — SARS CORONAVIRUS 2 (TAT 6-24 HRS): SARS Coronavirus 2: NEGATIVE

## 2022-08-27 ENCOUNTER — Other Ambulatory Visit: Payer: Self-pay | Admitting: Family Medicine

## 2022-11-04 IMAGING — US US MFM OB FOLLOW-UP
1 series · 13 of 28 positions shown · non-contrast
Comparison: none

[Series 1: us mfm ob follow-up · 13 of 51 slices shown]
[im 2/51]
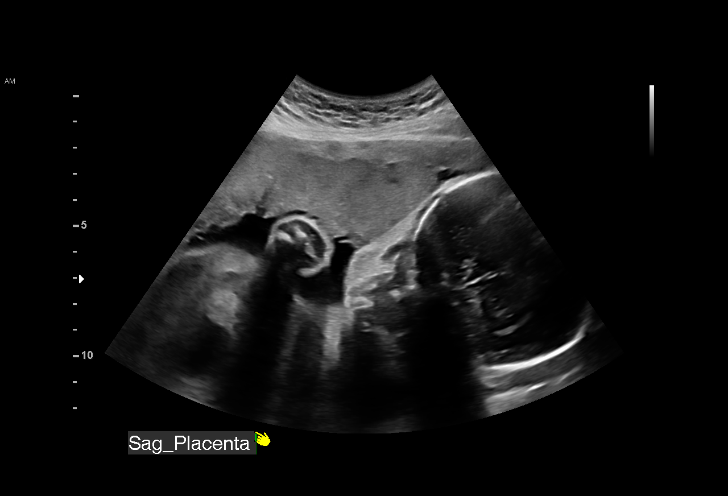
[im 6/51]
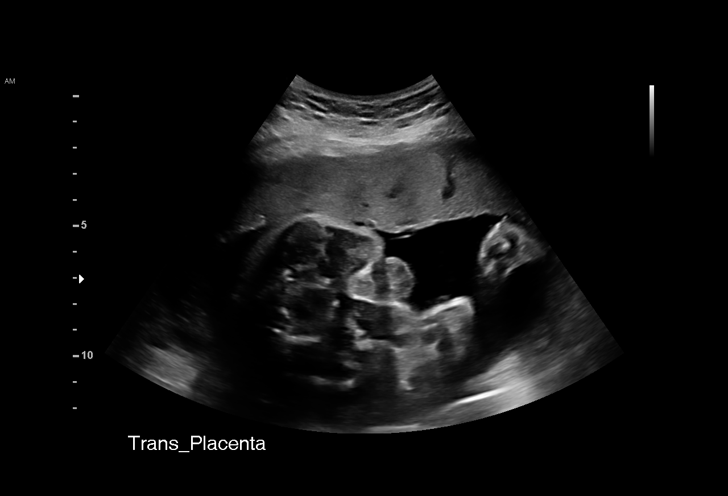
[im 10/51]
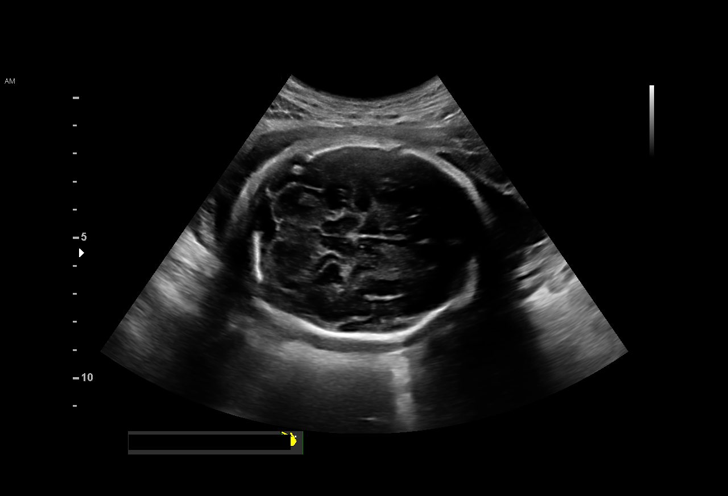
[im 13/51]
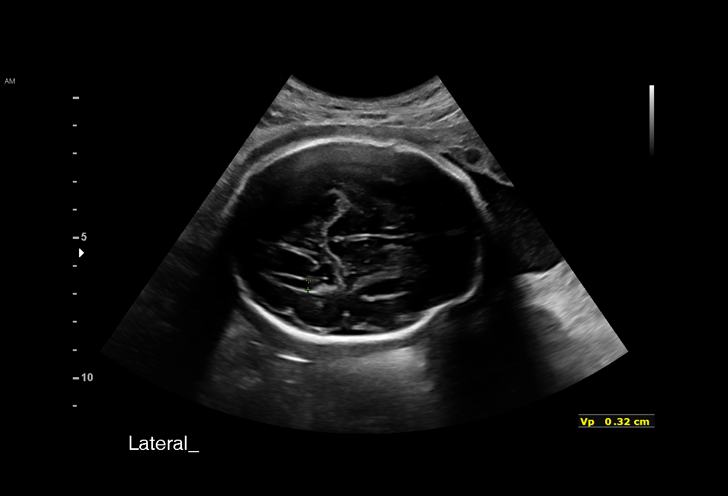
[im 17/51]
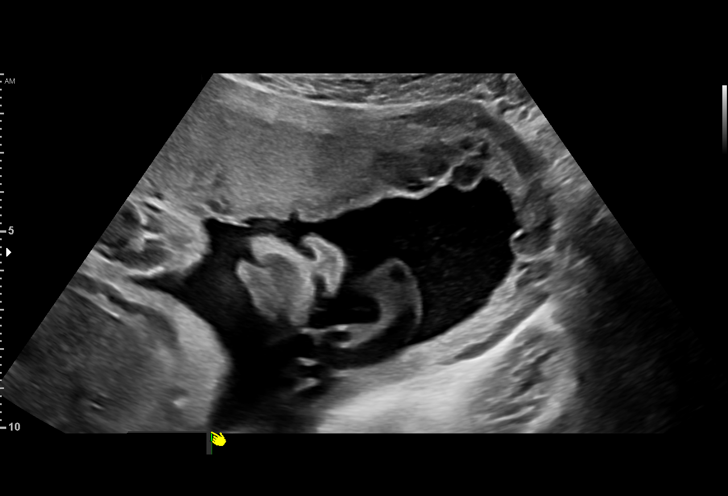
[im 21/51]
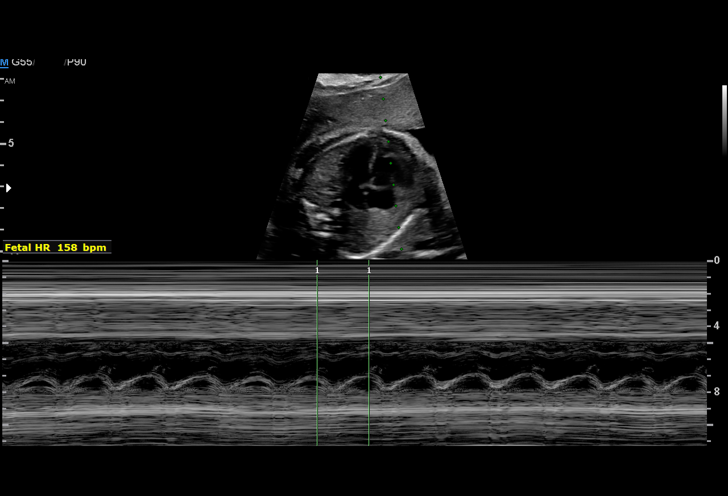
[im 26/51]
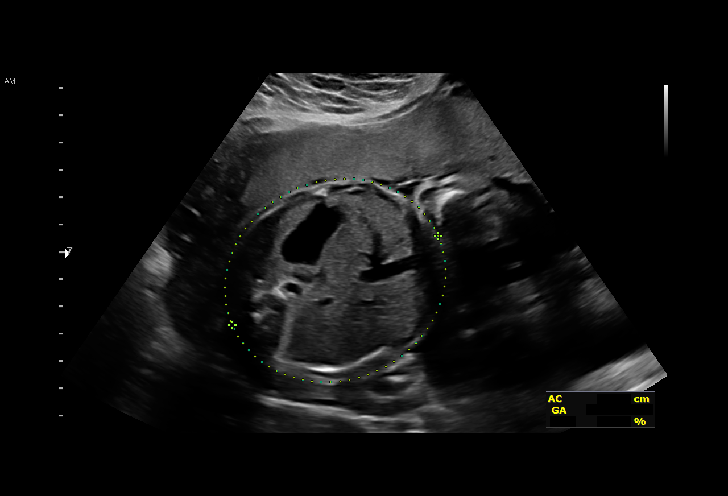
[im 30/51]
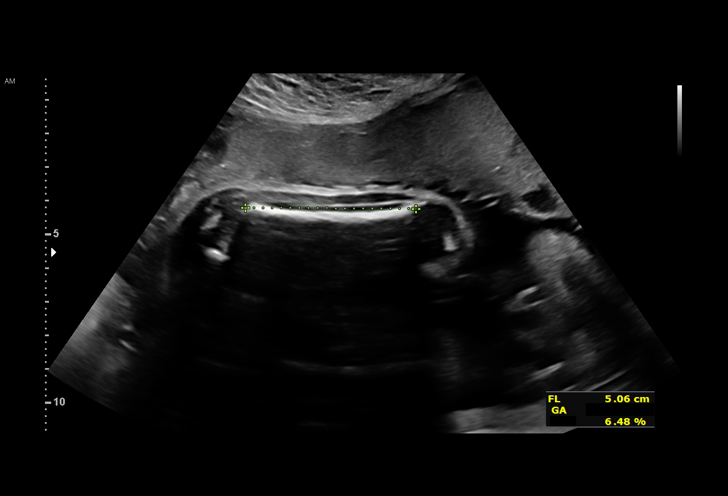
[im 34/51]
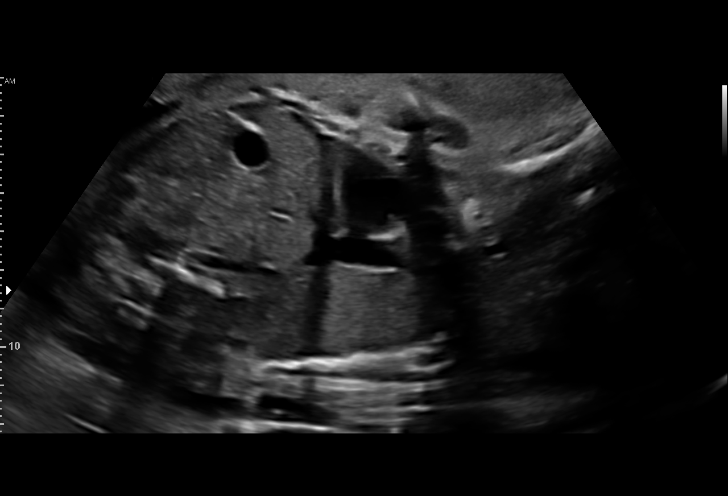
[im 38/51]
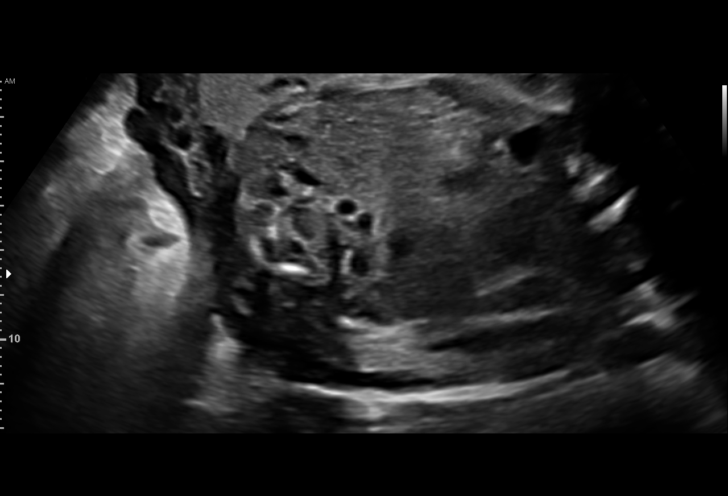
[im 41/51]
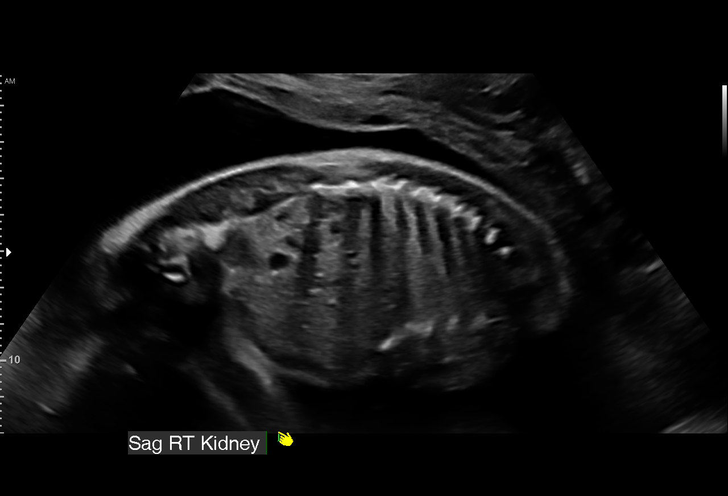
[im 45/51]
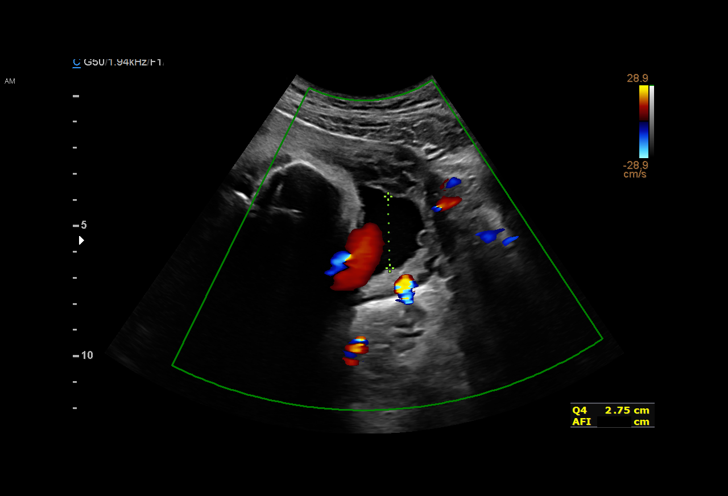
[im 49/51]
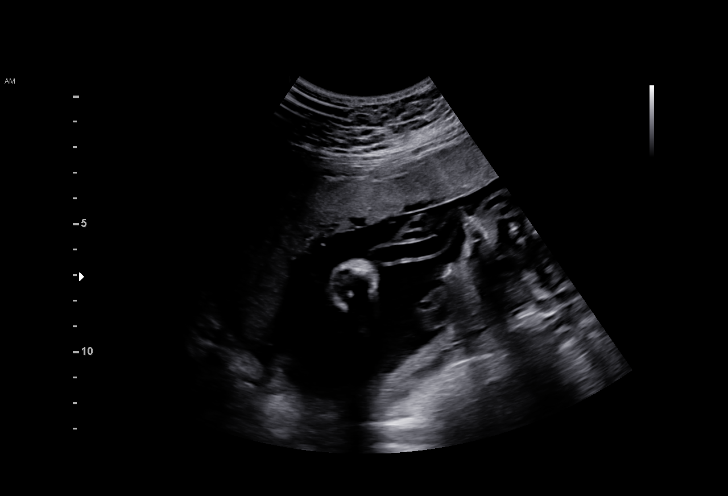

[13 of 28 positions shown; findings below may reference images not displayed]

Indications

 Hypertension - Chronic/Pre-existing
 (labetalol)
 Gestational diabetes in pregnancy, diet
 controlled
 28 weeks gestation of pregnancy
 Obesity complicating pregnancy, second
 trimester (BMI 33.5)
 LR NIPS/Negative Horizon/Negative AFP
 Uterine fibroid
Fetal Evaluation

 Num Of Fetuses:         1
 Fetal Heart Rate(bpm):  158
 Cardiac Activity:       Observed
 Presentation:           Cephalic
 Placenta:               Anterior
 P. Cord Insertion:      Previously Visualized

 Amniotic Fluid
 AFI FV:      Within normal limits

 AFI Sum(cm)     %Tile       Largest Pocket(cm)
 17.05           63

 RUQ(cm)       RLQ(cm)       LUQ(cm)        LLQ(cm)

Biometry

 BPD:      70.6  mm     G. Age:  28w 2d         31  %    CI:        70.48   %    70 - 86
                                                         FL/HC:      18.7   %    19.6 -
 HC:      268.1  mm     G. Age:  29w 2d         37  %    HC/AC:      1.11        0.99 -
 AC:      241.7  mm     G. Age:  28w 3d         39  %    FL/BPD:     71.0   %    71 - 87
 FL:       50.1  mm     G. Age:  27w 0d        4.6  %    FL/AC:      20.7   %    20 - 24
 CER:      35.9  mm     G. Age:  30w 0d         86  %
 LV:        3.2  mm
 CM:        6.1  mm
 Est. FW:    5532  gm      2 lb 9 oz     19  %
OB History

 Blood Type:   O+
 Gravidity:    1
 Living:       0
Gestational Age

 LMP:           28w 4d        Date:  11/19/20                 EDD:   08/26/21
 U/S Today:     28w 2d                                        EDD:   08/28/21
 Best:          28w 4d     Det. By:  LMP  (11/19/20)          EDD:   08/26/21
Anatomy

 Cranium:               Appears normal         LVOT:                   Appears normal
 Cavum:                 Appears normal         Aortic Arch:            Previously seen
 Ventricles:            Appears normal         Ductal Arch:            Previously seen
 Choroid Plexus:        Previously seen        Diaphragm:              Appears normal
 Cerebellum:            Appears normal         Stomach:                Appears normal, left
                                                                       sided
 Posterior Fossa:       Appears normal         Abdomen:                Previously seen
 Nuchal Fold:           Previously seen        Abdominal Wall:         Previously seen
 Face:                  Orbits and profile     Cord Vessels:           Previously seen
                        previously seen
 Lips:                  Appears normal         Kidneys:                Appear normal
 Palate:                Previously seen        Bladder:                Appears normal
 Thoracic:              Appears normal         Spine:                  Previously seen
                                                                       with Ltd views
 Heart:                 Appears normal         Upper Extremities:      Previously seen
                        (4CH, axis, and
                        situs)
 RVOT:                  Appears normal         Lower Extremities:      Previously seen

 Other:  Male gender previously seen. Lenses, Nasal bone, VC, 3VV and
         3VTV, heels/feet and open hands/5th digits visualized previously.
Cervix Uterus Adnexa

 Cervix
 Not visualized (advanced GA >44wks)

 Uterus
 No abnormality visualized.
 Right Ovary
 Within normal limits.

 Left Ovary
 Within normal limits.
Myomas

 Site                     L(cm)      W(cm)      D(cm)       Location
 Anterior Mid             2.3        3

 Blood Flow                  RI       PI       Comments

Comments

 This patient was seen for a follow up growth scan due to
 maternal obesity, chronic hypertension treated with labetalol
 400 mg twice a day, and diet-controlled gestational diabetes.
 She denies any problems since her last exam and reports
 good glycemic control.
 She was informed that the fetal growth and amniotic fluid
 level appears appropriate for her gestational age.
 Due to gestational diabetes and chronic hypertension, we will
 start weekly fetal testing at 32 weeks.

 A BPP was scheduled in 3 weeks.

## 2022-12-09 IMAGING — US US FETAL BPP W/ NON-STRESS
1 series · 14 of 15 positions shown · non-contrast
Comparison: none

[Series 1: us fetal bpp w/ non-stress · 15 acquisitions, 14 frames shown]
[im 1/15]
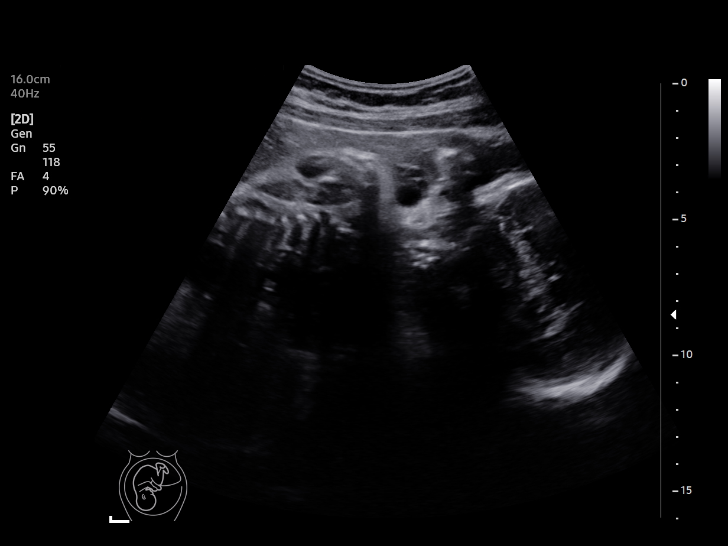
[im 2/15]
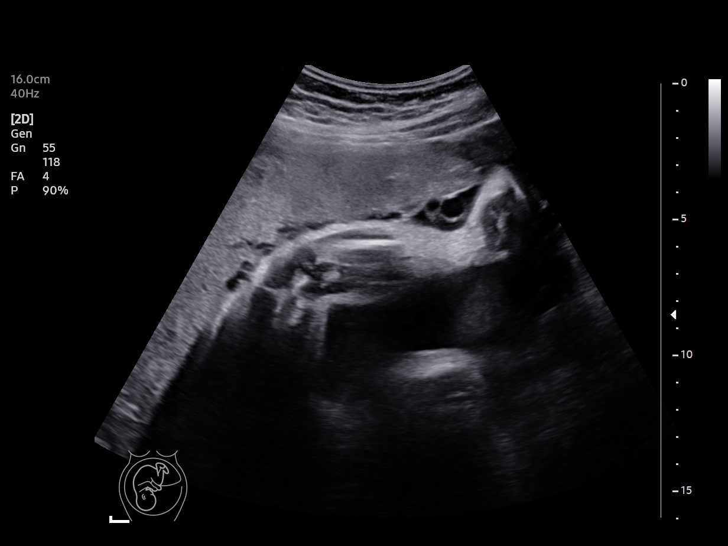
[im 3/15]
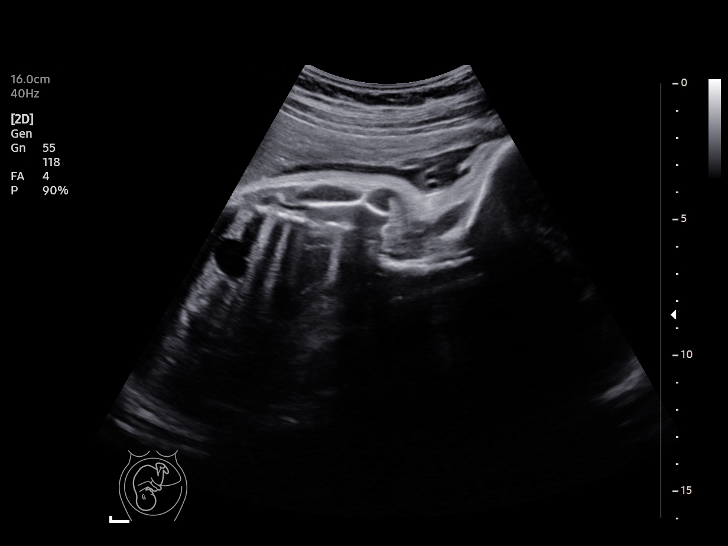
[im 4/15]
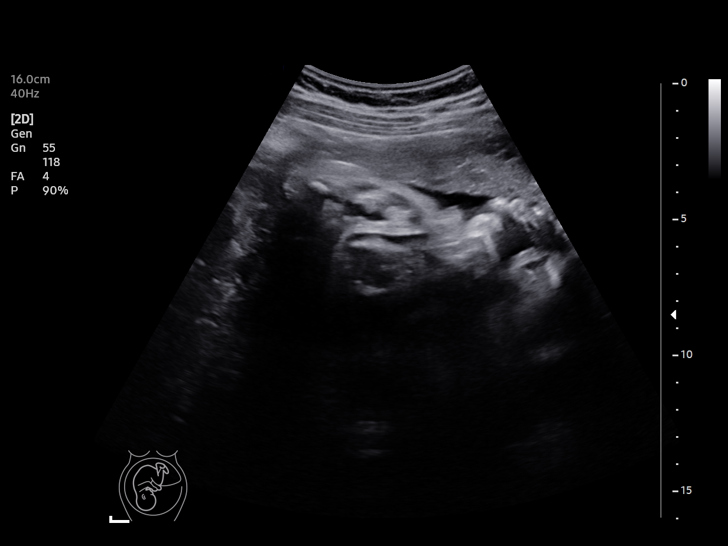
[im 5/15]
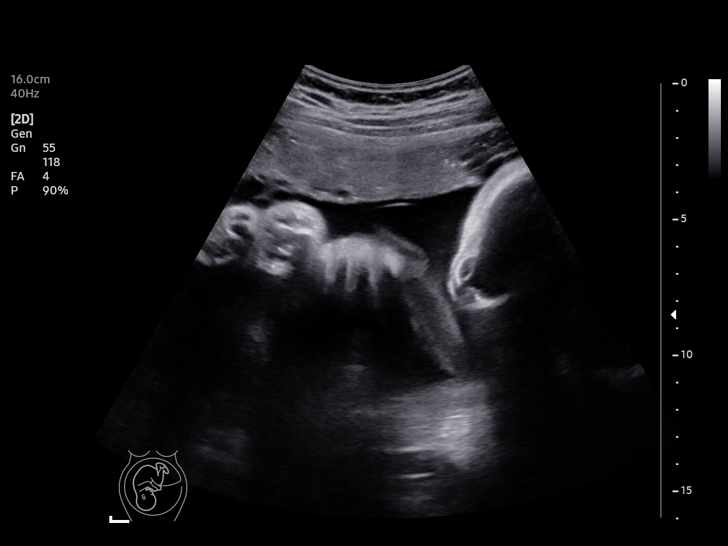
[im 6/15]
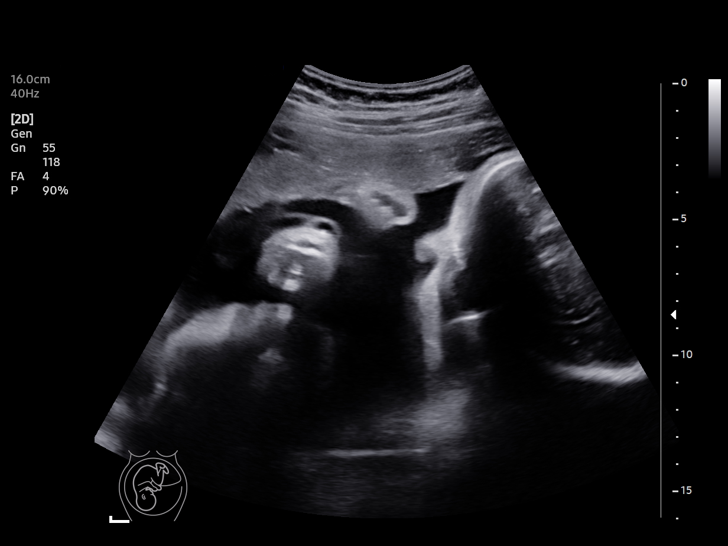
[im 7/15]
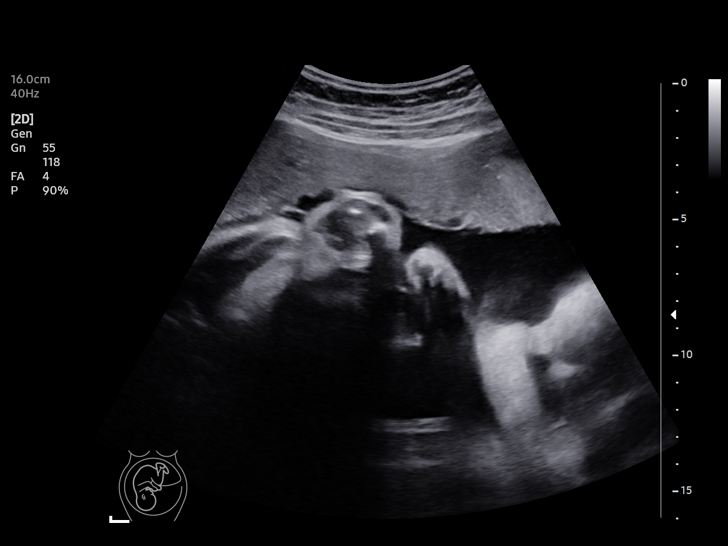
[im 9/15]
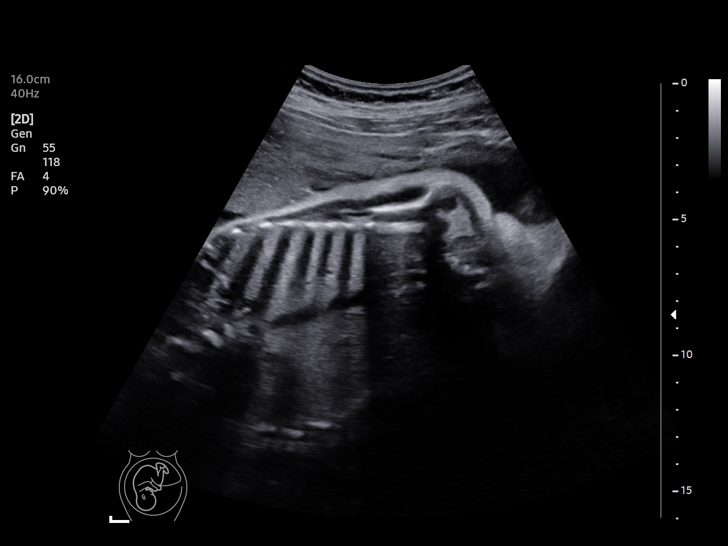
[im 10/15]
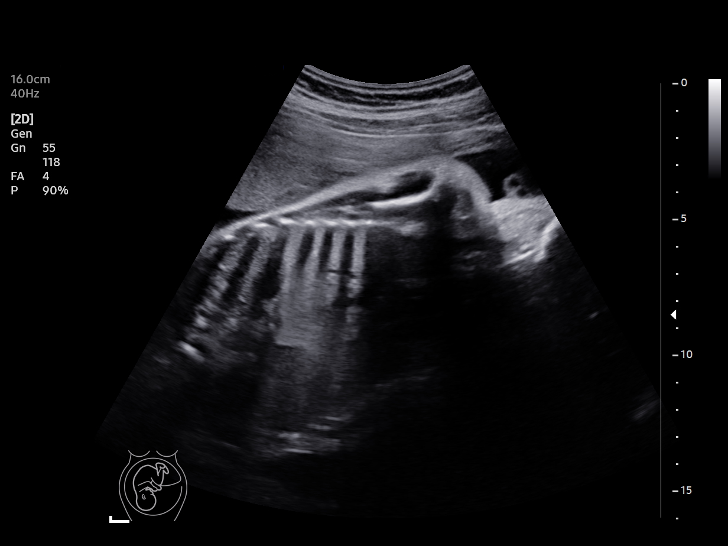
[im 11/15]
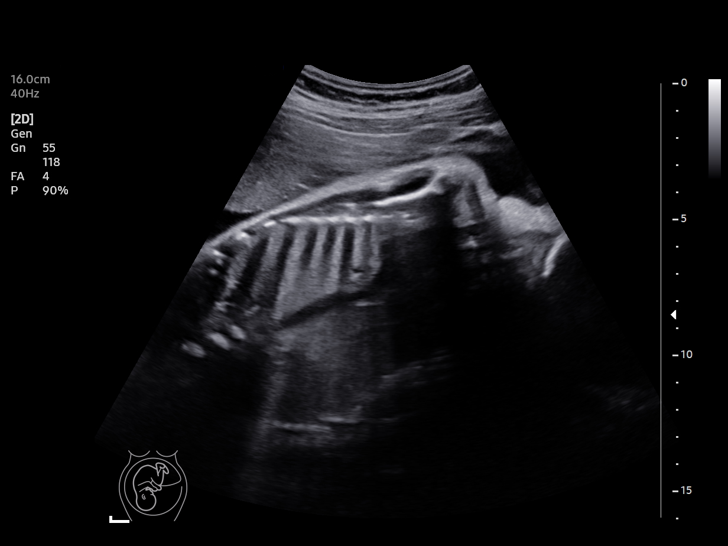
[im 12/15]
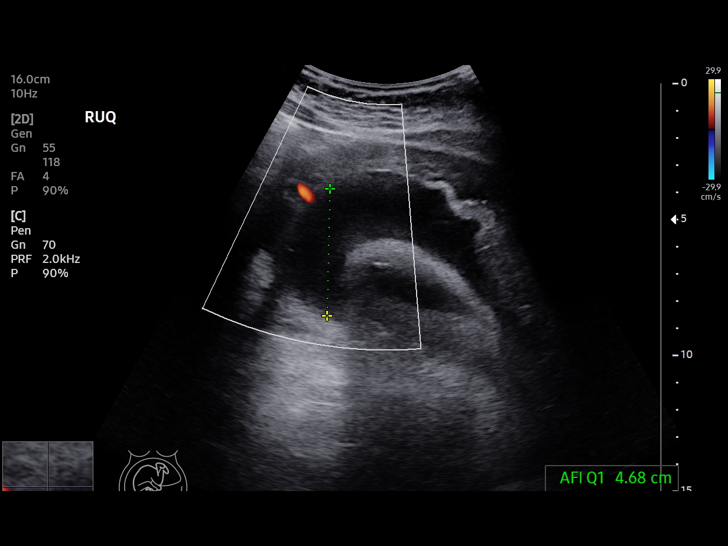
[im 13/15]
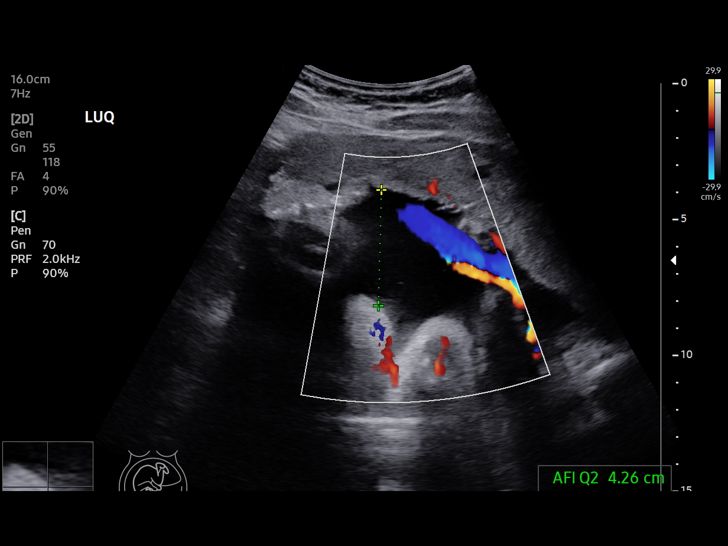
[im 14/15]
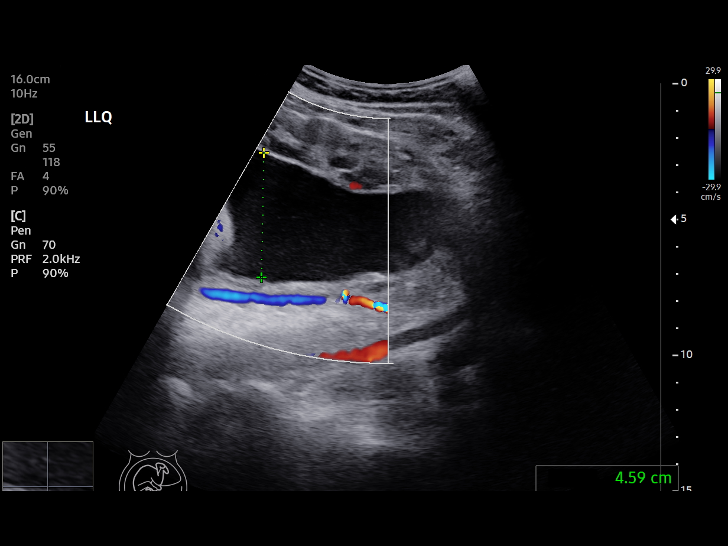
[im 15/15]
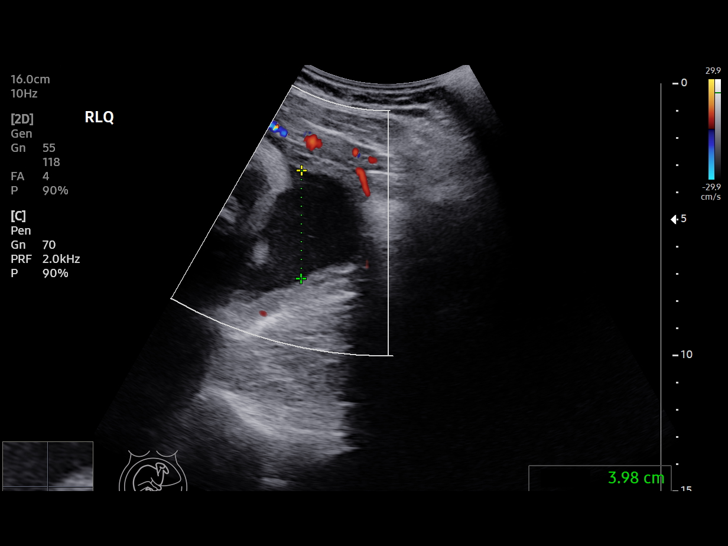

[14 of 15 positions shown; findings below may reference images not displayed]

[REDACTED]care at

 1  US FETAL BPP W/NONSTRESS              76818.4     KRIJNA VERHEESEN

Service(s) Provided

Indications

 33 weeks gestation of pregnancy
 Hypertension - Chronic/Pre-existing
 Gestational diabetes in pregnancy, diet
 controlled
Fetal Evaluation

 Num Of Fetuses:         1
 Preg. Location:         Intrauterine
 Cardiac Activity:       Observed
 Presentation:           Cephalic

 Amniotic Fluid
 AFI FV:      Within normal limits

 AFI Sum(cm)     %Tile       Largest Pocket(cm)
 17.51           64

 RUQ(cm)       RLQ(cm)       LUQ(cm)        LLQ(cm)

Biophysical Evaluation
 Amniotic F.V:   Pocket => 2 cm             F. Tone:        Observed
 F. Movement:    Observed                   N.S.T:          Reactive
 F. Breathing:   Observed                   Score:          [DATE]
OB History

 Blood Type:   O+
 Gravidity:    1
 Living:       0
Gestational Age

 LMP:           33w 4d        Date:  11/19/20                  EDD:   08/26/21
 Best:          33w 4d     Det. By:  LMP  (11/19/20)          EDD:   08/26/21
Impression

 33 w 4 d NST is reactive. BPP [DATE].
Recommendations

 Continue weekly antenatal testing till delivery .

## 2023-01-06 IMAGING — US US FETAL BPP W/ NON-STRESS
1 series · 13 of 15 positions shown · non-contrast
Comparison: none

[Series 1: us fetal bpp w/ non-stress · 15 acquisitions, 13 frames shown]
[im 1/15]
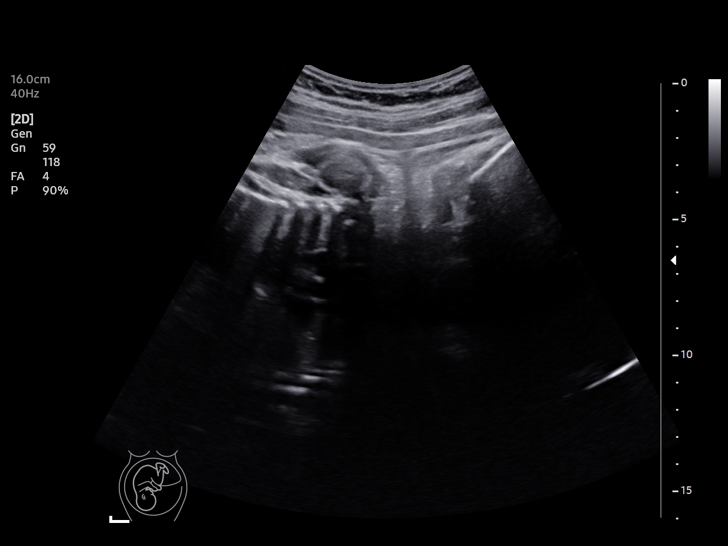
[im 2/15]
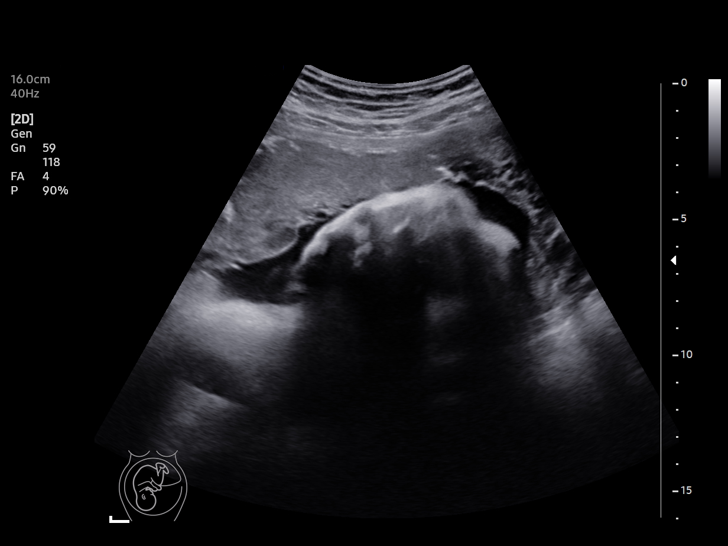
[im 3/15]
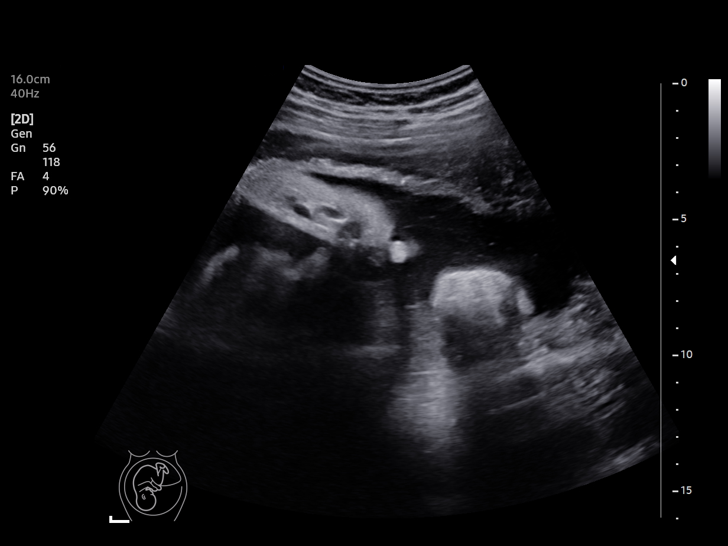
[im 5/15]
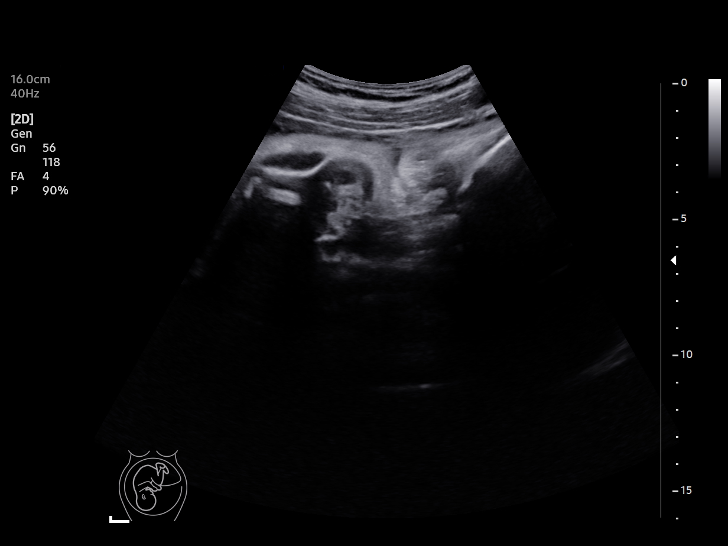
[im 6/15]
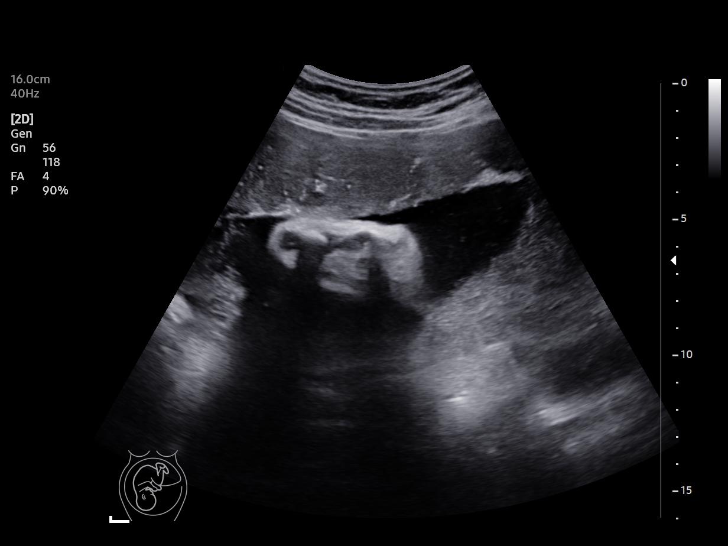
[im 7/15]
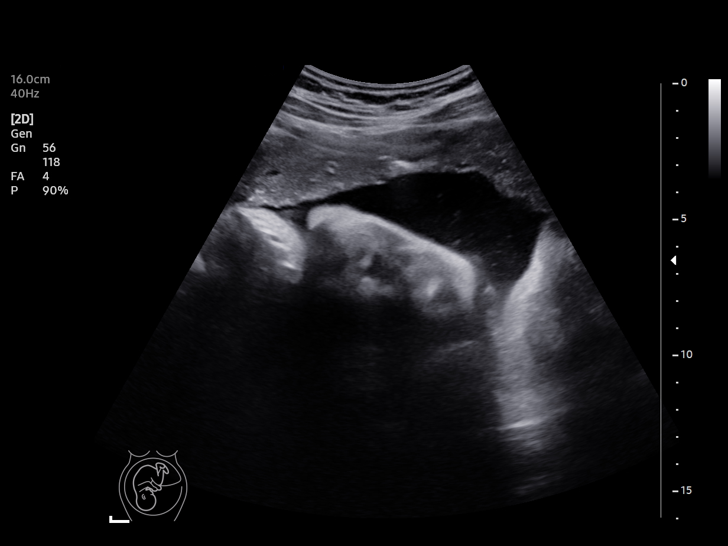
[im 8/15]
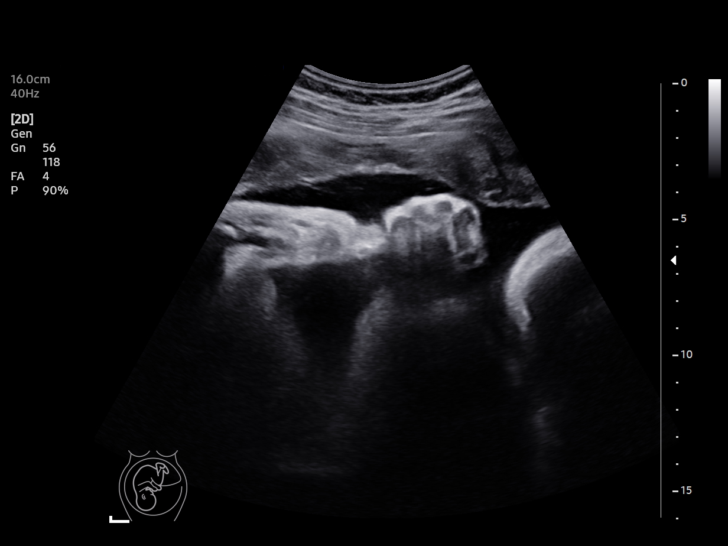
[im 9/15]
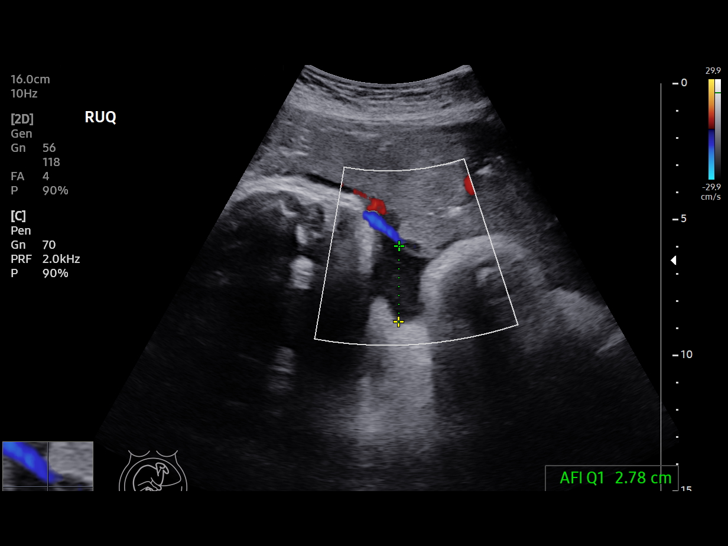
[im 10/15]
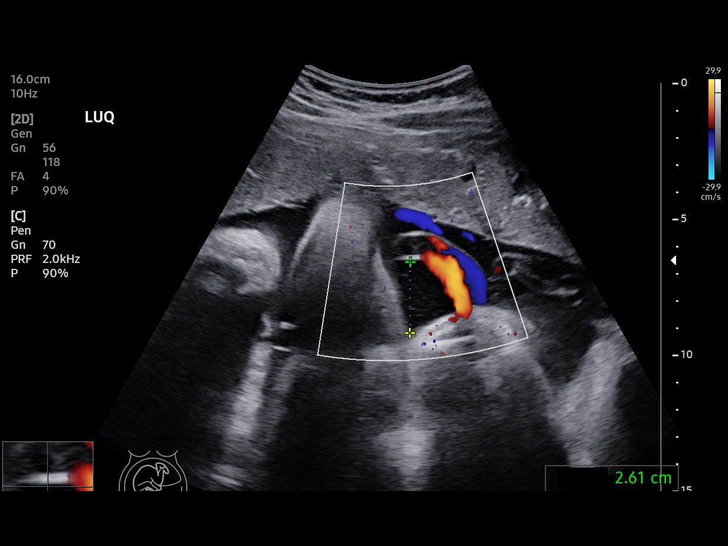
[im 11/15]
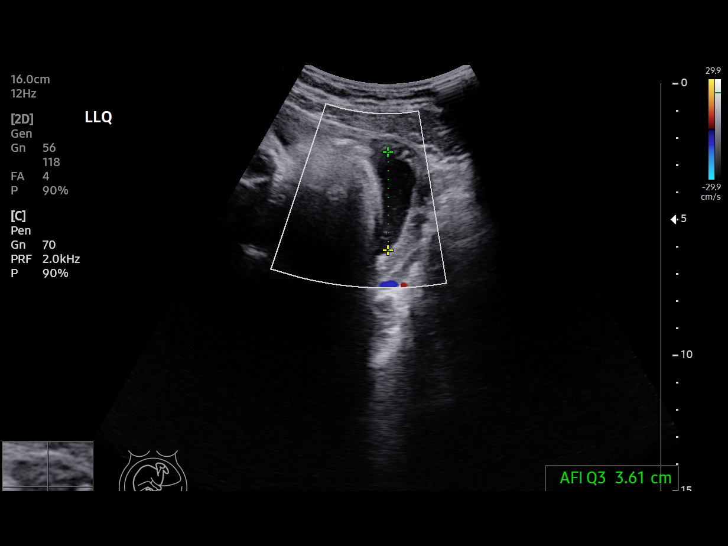
[im 13/15]
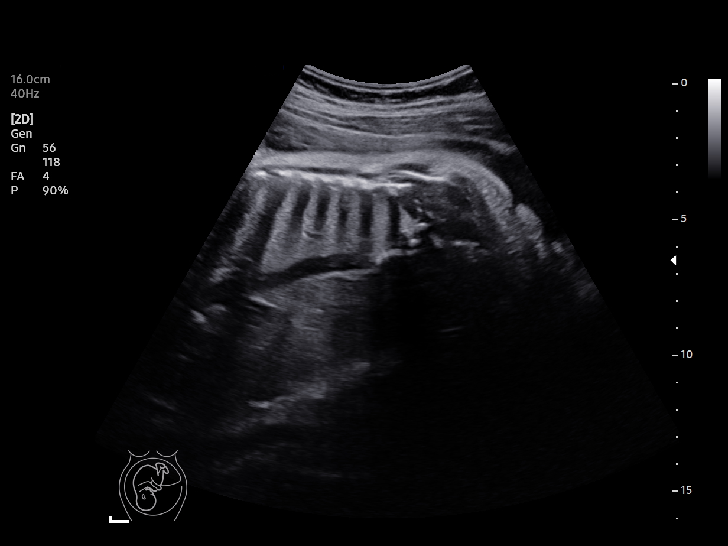
[im 14/15]
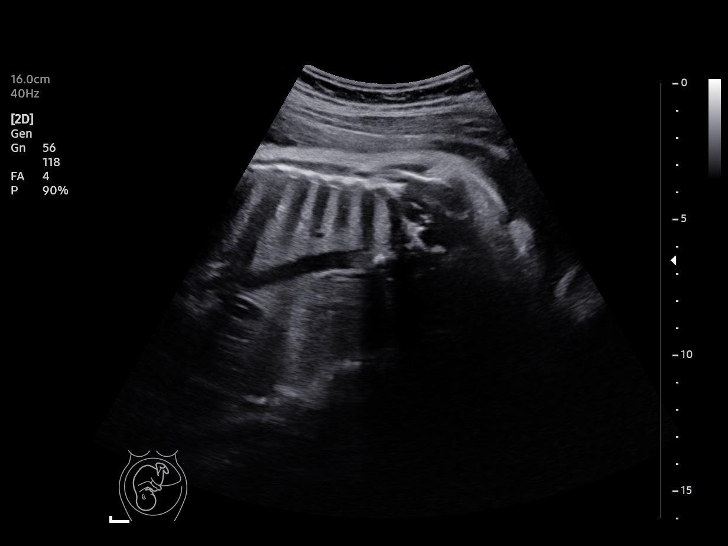
[im 15/15]
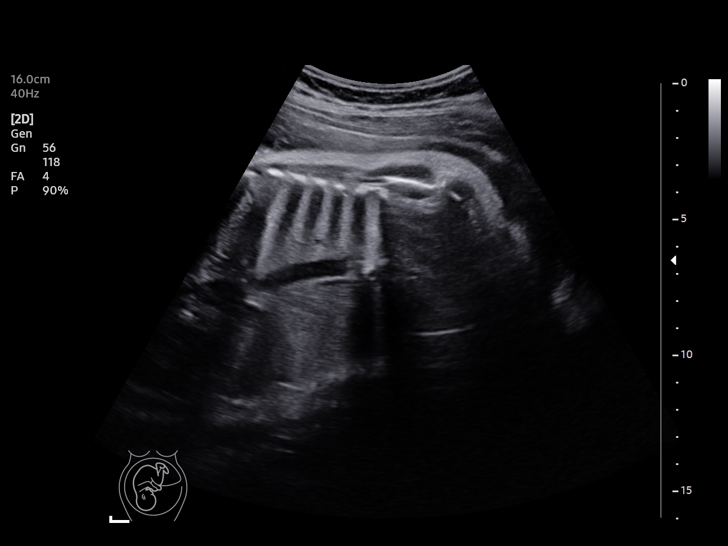

[13 of 15 positions shown; findings below may reference images not displayed]

[REDACTED]care at

 1  US FETAL BPP W/NONSTRESS              76818.4     STEWART RYO

Service(s) Provided

Indications

 37 weeks gestation of pregnancy
 Obesity complicating pregnancy, third
 trimester
 Gestational diabetes in pregnancy, diet
 controlled
 Hypertension - Chronic/Pre-existing
Fetal Evaluation

 Num Of Fetuses:         1
 Preg. Location:         Intrauterine
 Cardiac Activity:       Observed
 Presentation:           Cephalic

 Amniotic Fluid
 AFI FV:      Within normal limits

 AFI Sum(cm)     %Tile       Largest Pocket(cm)
 11.57           37

 RUQ(cm)       RLQ(cm)       LUQ(cm)        LLQ(cm)

Biophysical Evaluation

 Amniotic F.V:   Pocket => 2 cm             F. Tone:        Observed
 F. Movement:    Observed                   N.S.T:          Reactive
 F. Breathing:   Observed                   Score:          [DATE]
OB History

 Blood Type:   O+
 Gravidity:    1
 Living:       0
Gestational Age

 LMP:           37w 4d        Date:  11/19/20                  EDD:   08/26/21
 Best:          37w 4d     Det. By:  LMP  (11/19/20)          EDD:   08/26/21
Impression

 IUP @ 37 [DATE] weeks
 GDM A2 on Metformin
 CHTN on Labetalol
Recommendations

 Continue weekly antenatal testing till delivery .
                 Maesano, Geni

## 2023-04-05 ENCOUNTER — Ambulatory Visit
Admission: RE | Admit: 2023-04-05 | Discharge: 2023-04-05 | Disposition: A | Discharge status: Home / Self Care | Payer: Managed Care, Other (non HMO) | Source: Ambulatory Visit | Attending: Family Medicine | Admitting: Family Medicine

## 2023-04-05 ENCOUNTER — Other Ambulatory Visit: Payer: Self-pay

## 2023-04-05 VITALS — BP 150/99 | HR 101 | Temp 99.4°F | Resp 18

## 2023-04-05 DIAGNOSIS — Z20822 Contact with and (suspected) exposure to covid-19: Secondary | ICD-10-CM | POA: Diagnosis present

## 2023-04-05 DIAGNOSIS — J069 Acute upper respiratory infection, unspecified: Secondary | ICD-10-CM | POA: Insufficient documentation

## 2023-04-05 LAB — SARS CORONAVIRUS 2 BY RT PCR: SARS Coronavirus 2 by RT PCR: POSITIVE — AB

## 2023-04-05 MED ORDER — HYDROCODONE BIT-HOMATROP MBR 5-1.5 MG/5ML PO SOLN: 5.0000 mL | Freq: Four times a day (QID) | ORAL | 0 refills | Status: AC | PRN

## 2023-04-05 NOTE — ED Provider Notes (Signed)
[facility]   [id number] 04/05/23 Arrival Time: 1112  ASSESSMENT & PLAN:  1. Close exposure to COVID-19 virus   2. Viral URI with cough     Discussed typical duration of likely viral illness. COVID PCR testing pending. Work note provided. OTC symptom care as needed.  New Prescriptions   HYDROCODONE BIT-HOMATROPINE (HYCODAN) 5-1.5 MG/5ML SYRUP    Take 5 mLs by mouth every 6 (six) hours as needed for cough.     Follow-up Information     Worthy Flank, NP.   Specialty: Nurse Practitioner Why: As needed. Contact information: [address] [place] [address] [phone]                  Discharge Instructions      You have been tested for COVID-19 today. If your test returns positive, you will receive a phone call from [facility] regarding your results. Negative test results are not called. Both positive and negative results area always visible on MyChart. If you do not have a MyChart account, sign up instructions are provided in your discharge papers. Please do not hesitate to contact us should you have questions or concerns.  Be aware, your cough medication may cause drowsiness. Please do not drive, operate heavy machinery or make important decisions while on this medication, it can cloud your judgement.       Reviewed expectations re: course of current medical issues. Questions answered. Outlined signs and symptoms indicating need for more acute intervention. Understanding verbalized. After Visit Summary given.   SUBJECTIVE: History from: Patient. Tara Chase is a 27 y.o. female. Pt reports her son tested positive for covid on Monday. She had negative home test yesterday and wants to make sure of her status for work. Reports sore, throat, cough and congestion since yesterday . Denies: difficulty breathing. Normal PO intake without n/v/d.  OBJECTIVE:  Vitals:   04/05/23 1148  BP: (!) 150/99  Pulse: (!) 101  Resp: 18   Temp: 99.4 F (37.4 C)  TempSrc: Oral  SpO2: 96%    General appearance: alert; no distress but appear fatigued Eyes: PERRLA; EOMI; conjunctiva normal HENT: [place]; AT; with nasal congestion Neck: supple  Lungs: speaks full sentences without difficulty; unlabored; dry cough Extremities: no edema Skin: warm and dry Neurologic: normal gait Psychological: alert and cooperative; normal mood and affect  Labs:  Labs Reviewed  SARS CORONAVIRUS 2 BY RT PCR    Imaging: No results found.  Allergies  Allergen Reactions   Pollen Extract Other (See Comments)    Seasonal allergies    Past Medical History:  Diagnosis Date   Asthma    Gestational diabetes    Hypertension    Social History   Socioeconomic History   Marital status: Single    Spouse name: Not on file   Number of children: Not on file   Years of education: Not on file   Highest education level: Not on file  Occupational History   Not on file  Tobacco Use   Smoking status: Never   Smokeless tobacco: Never  Vaping Use   Vaping status: Never Used  Substance and Sexual Activity   Alcohol use: Not Currently   Drug use: No   Sexual activity: Yes    Partners: Male    Birth control/protection: None, Condom    Comment: 1st intercourse 69 yo-5 partners  Other Topics Concern   Not on file  Social History Narrative   Not on file  Social Determinants of Health   Financial Resource Strain: Not on file  Food Insecurity: Not on file  Transportation Needs: Not on file  Physical Activity: Not on file  Stress: Not on file  Social Connections: Not on file  Intimate Partner Violence: Not on file   Family History  Problem Relation Age of Onset   Hypertension Mother    Hypertension Father    Stroke Father    Cancer Maternal Grandfather        prostate   Cancer Paternal Grandfather        colon   Past Surgical History:  Procedure Laterality Date   CESAREAN SECTION N/A 08/21/2021   Procedure: CESAREAN SECTION;   Surgeon: Adam Phenix, MD;  Location: [facility];  Service: Obstetrics;  Laterality: N/A;   NO PAST SURGERIES       Mardella Layman, MD 04/05/23 1224

## 2023-04-05 NOTE — Discharge Instructions (Addendum)
You have been tested for COVID-19 today. If your test returns positive, you will receive a phone call from [facility] regarding your results. Negative test results are not called. Both positive and negative results area always visible on MyChart. If you do not have a MyChart account, sign up instructions are provided in your discharge papers. Please do not hesitate to contact us should you have questions or concerns.  Be aware, your cough medication may cause drowsiness. Please do not drive, operate heavy machinery or make important decisions while on this medication, it can cloud your judgement.  

## 2023-04-05 NOTE — ED Triage Notes (Signed)
Pt reports her son tested positive for covid on Monday. She had negative home test yesterday and wants to make sure of her status for work. Reports sore, throat, cough and congestion since yesterday

## 2023-06-15 ENCOUNTER — Ambulatory Visit: Payer: Managed Care, Other (non HMO) | Admitting: Family Medicine

## 2023-06-15 VITALS — BP 141/93 | HR 94 | Ht 64.0 in | Wt 202.0 lb

## 2023-06-15 DIAGNOSIS — N921 Excessive and frequent menstruation with irregular cycle: Secondary | ICD-10-CM | POA: Diagnosis not present

## 2023-06-15 DIAGNOSIS — Z975 Presence of (intrauterine) contraceptive device: Secondary | ICD-10-CM

## 2023-06-15 MED ORDER — NORETHIN ACE-ETH ESTRAD-FE 1-20 MG-MCG(24) PO TABS: 1.0000 | ORAL_TABLET | Freq: Every day | ORAL | 0 refills | Status: DC

## 2023-06-15 NOTE — Progress Notes (Signed)
   Subjective:    Patient ID: Tara Chase, female    DOB: 12/06/1995, 28 y.o.   MRN: [id number]  HPI Patient seen for breakthrough bleeding with IUD. She had the LN IUD placed 09/2021. For the last 2-3 months, she has had fairly consistent spotting and sometimes light bleeding. Overall, she likes the IUD and would like to keep it in.   Review of Systems     Objective:   Physical Exam Vitals reviewed.  Constitutional:      Appearance: Normal appearance.  Skin:    General: Skin is warm and dry.     Capillary Refill: Capillary refill takes less than 2 seconds.  Neurological:     General: No focal deficit present.     Mental Status: She is alert.  Psychiatric:        Mood and Affect: Mood normal.        Behavior: Behavior normal.        Thought Content: Thought content normal.        Judgment: Judgment normal.       Assessment & Plan:  1. Breakthrough bleeding with IUD (Primary) Will try COC for the next couple of months to try to control the bleeding. If this doesn't help over the next couple of weeks, will get [place].

## 2023-07-13 ENCOUNTER — Encounter: Payer: Self-pay | Admitting: Family Medicine

## 2023-07-14 ENCOUNTER — Telehealth: Payer: Self-pay

## 2023-07-14 NOTE — Telephone Encounter (Signed)
 Returned call to patient regarding bleeding.  Patient was started on OC to help with bleeding.  Just finishing 1st pack this week.  Was provided with 3 month supply.  Encouraged patient to continue taking placebo and restart the next pack.  Informed it may take a few months to regulate.  Call back with any concerns.  Danna Hefty [employer]

## 2023-07-20 ENCOUNTER — Ambulatory Visit: Payer: Managed Care, Other (non HMO) | Admitting: Family

## 2023-07-20 VITALS — BP 148/105 | HR 85 | Temp 98.4°F | Ht 65.0 in | Wt 205.6 lb

## 2023-07-20 DIAGNOSIS — E669 Obesity, unspecified: Secondary | ICD-10-CM | POA: Diagnosis not present

## 2023-07-20 DIAGNOSIS — I10 Essential (primary) hypertension: Secondary | ICD-10-CM | POA: Diagnosis not present

## 2023-07-20 DIAGNOSIS — Z6834 Body mass index (BMI) 34.0-34.9, adult: Secondary | ICD-10-CM | POA: Diagnosis not present

## 2023-07-20 DIAGNOSIS — R600 Localized edema: Secondary | ICD-10-CM | POA: Diagnosis not present

## 2023-07-20 DIAGNOSIS — Z7689 Persons encountering health services in other specified circumstances: Secondary | ICD-10-CM

## 2023-07-20 MED ORDER — HYDROCHLOROTHIAZIDE 25 MG PO TABS: 25.0000 mg | ORAL_TABLET | Freq: Every day | ORAL | 0 refills | Status: DC

## 2023-07-20 MED ORDER — AMLODIPINE BESYLATE 10 MG PO TABS: 10.0000 mg | ORAL_TABLET | Freq: Every day | ORAL | 0 refills | Status: AC

## 2023-07-20 NOTE — Progress Notes (Signed)
 Subjective:    AINSLEE SOU - 28 y.o. female MRN [id number]  Date of birth: 1995/10/29  HPI  Dayzha JILLIANNA STANEK is to establish care.   Current issues and/or concerns: - High blood pressure. Doing well on Amlodipine and Hydrochlorothiazide, no issues/concerns. She is trying to limit salt intake. She does not exercise outside of normal routine. She does not complain of red flag symptoms such as but not limited to chest pain, shortness of breath, worst headache of life, nausea/vomiting.  - Intermittent right hand swelling. Denies recent trauma/injury and red flag symptoms. States "achy". Denies red flag symptoms.  - Weight gain. States thinks may be due to she is has an IUD and taking a birth control pill managed by Gynecology.  - No further issues/concerns for discussion today.  ROS per HPI     Health Maintenance:  Health Maintenance Due  Topic Date Due   Pneumococcal Vaccine 55-6 Years old (1 of 2 - PCV) Never done   INFLUENZA VACCINE  12/01/2022   COVID-19 Vaccine (1 - 2024-25 season) Never done     Past Medical History: Patient Active Problem List   Diagnosis Date Noted   Low grade squamous intraepithelial lesion (LGSIL) on cervical Pap smear 02/02/2016      Social History   reports that she has never smoked. She has never used smokeless tobacco. She reports that she does not currently use alcohol. She reports that she does not use drugs.   Family History  family history includes Cancer in her maternal grandfather and paternal grandfather; Hypertension in her father and mother; Stroke in her father.   Medications: reviewed and updated   Objective:   Physical Exam BP (!) 148/105   Pulse 85   Temp 98.4 F (36.9 C) (Oral)   Ht 5\' 5"  (1.651 m)   Wt 205 lb 9.6 oz (93.3 kg)   SpO2 96%   BMI 34.21 kg/m   Physical Exam HENT:     Head: Normocephalic and atraumatic.     Nose: Nose normal.     Mouth/Throat:     Mouth: Mucous membranes are moist.     Pharynx:  Oropharynx is clear.  Eyes:     Extraocular Movements: Extraocular movements intact.     Conjunctiva/sclera: Conjunctivae normal.     Pupils: Pupils are equal, round, and reactive to light.  Cardiovascular:     Rate and Rhythm: Normal rate and regular rhythm.     Pulses: Normal pulses.     Heart sounds: Normal heart sounds.  Pulmonary:     Effort: Pulmonary effort is normal.     Breath sounds: Normal breath sounds.  Musculoskeletal:        General: Normal range of motion.     Cervical back: Normal range of motion and neck supple.  Neurological:     General: No focal deficit present.     Mental Status: She is alert and oriented to person, place, and time.  Psychiatric:        Mood and Affect: Mood normal.        Behavior: Behavior normal.       Assessment & Plan:  1. Encounter to establish care (Primary) - Patient presents today to establish care. During the interim follow-up with primary provider as scheduled.  - Return for annual physical examination, labs, and health maintenance. Arrive fasting meaning having no food for at least 8 hours prior to appointment. You may have only water or black coffee. Please take scheduled  medications as normal.  2. Primary hypertension - Blood pressure not at goal during today's visit. Patient asymptomatic without chest pressure, chest pain, palpitations, shortness of breath, worst headache of life, and any additional red flag symptoms. - Continue Amlodipine as prescribed.  - Increase Hydrochlorothiazide from 12.5 mg to 25 mg as prescribed.  - Routine screening.  - Counseled on blood pressure goal of less than 130/80, low-sodium, DASH diet, medication compliance, and 150 minutes of moderate intensity exercise per week as tolerated. Counseled on medication adherence and adverse effects. - Follow-up with primary provider in 4 weeks or sooner if needed. - Basic Metabolic Panel - amLODipine (NORVASC) 10 MG tablet; Take 1 tablet (10 mg total) by  mouth daily.  Dispense: 90 tablet; Refill: 0 - hydrochlorothiazide (HYDRODIURIL) 25 MG tablet; Take 1 tablet (25 mg total) by mouth daily.  Dispense: 90 tablet; Refill: 0  3. Hand edema - Referral to Orthopedic Surgery for evaluation/management. - Ambulatory referral to Orthopedic Surgery  4. Encounter for weight management 5. BMI 34.0-34.9,adult - Patient declined referral to Medical Weight Management.  - Follow-up with primary provider as scheduled.    Patient was given clear instructions to go to Emergency Department or return to medical center if symptoms don't improve, worsen, or new problems develop.The patient verbalized understanding.  I discussed the assessment and treatment plan with the patient. The patient was provided an opportunity to ask questions and all were answered. The patient agreed with the plan and demonstrated an understanding of the instructions.   The patient was advised to call back or seek an in-person evaluation if the symptoms worsen or if the condition fails to improve as anticipated.    Ricky Stabs, NP 07/20/2023, 11:22 AM Primary Care at [facility]

## 2023-07-20 NOTE — Progress Notes (Signed)
 Patient here to establish mainly due to blood pressure.   No other concerns to discuss.

## 2023-07-21 ENCOUNTER — Encounter: Payer: Self-pay | Admitting: Family

## 2023-07-21 LAB — BASIC METABOLIC PANEL
BUN/Creatinine Ratio: 13 (ref 9–23)
BUN: 9 mg/dL (ref 6–20)
CO2: 22 mmol/L (ref 20–29)
Calcium: 9.3 mg/dL (ref 8.7–10.2)
Chloride: 103 mmol/L (ref 96–106)
Creatinine, Ser: 0.69 mg/dL (ref 0.57–1.00)
Glucose: 85 mg/dL (ref 70–99)
Potassium: 4.2 mmol/L (ref 3.5–5.2)
Sodium: 140 mmol/L (ref 134–144)
eGFR: 120 mL/min/{1.73_m2} (ref >59)

## 2023-08-17 ENCOUNTER — Encounter: Payer: Self-pay | Admitting: Family

## 2023-08-17 ENCOUNTER — Ambulatory Visit: Admitting: Family

## 2023-08-17 VITALS — BP 145/91 | HR 94 | Temp 99.1°F | Resp 16 | Ht 65.0 in | Wt 205.4 lb

## 2023-08-17 DIAGNOSIS — R600 Localized edema: Secondary | ICD-10-CM | POA: Diagnosis not present

## 2023-08-17 DIAGNOSIS — J3089 Other allergic rhinitis: Secondary | ICD-10-CM

## 2023-08-17 DIAGNOSIS — R238 Other skin changes: Secondary | ICD-10-CM | POA: Diagnosis not present

## 2023-08-17 DIAGNOSIS — I10 Essential (primary) hypertension: Secondary | ICD-10-CM

## 2023-08-17 MED ORDER — HYDROCHLOROTHIAZIDE 50 MG PO TABS
50.0000 mg | ORAL_TABLET | Freq: Every day | ORAL | 0 refills | Status: DC
Start: 2023-08-17 — End: 2023-10-25

## 2023-08-17 MED ORDER — FEXOFENADINE HCL 60 MG PO TABS: 60.0000 mg | ORAL_TABLET | Freq: Every day | ORAL | 0 refills | Status: AC

## 2023-08-17 NOTE — Progress Notes (Signed)
 Patient ID: Tara Chase, female    DOB: 1995/08/29  MRN: [id number]  CC: Chronic Conditions Follow-Up  Subjective: Tara Chase is a 28 y.o. female who presents for chronic conditions follow-up.   Her concerns today include:  - Doing well on Hydrochlorothiazide, no issues/concerns. The patient does not complain of red flag symptoms such as but not limited to chest pain, shortness of breath, worst headache of life, nausea/vomiting.  - Allergies affecting sinuses.  - States she missed call from Orthopedics and needs new referral. - Reports dry and flaking scalp. Requests referral to Dermatology.   Patient Active Problem List   Diagnosis Date Noted   Low grade squamous intraepithelial lesion (LGSIL) on cervical Pap smear 02/02/2016     Current Outpatient Medications on File Prior to Visit  Medication Sig Dispense Refill   albuterol (VENTOLIN HFA) 108 (90 Base) MCG/ACT inhaler Inhale 2 puffs into the lungs every 6 (six) hours as needed.     amLODipine (NORVASC) 10 MG tablet Take 1 tablet (10 mg total) by mouth daily. 90 tablet 0   hydrochlorothiazide (MICROZIDE) 12.5 MG capsule Take 12.5 mg by mouth daily.     levonorgestrel (LILETTA, 52 MG,) 20.1 MCG/DAY IUD IUD 1 each by Intrauterine route once.     Norethindrone Acetate-Ethinyl Estrad-FE (LOESTRIN 24 FE) 1-20 MG-MCG(24) tablet Take 1 tablet by mouth daily. 90 tablet 0   HYDROcodone bit-homatropine (HYCODAN) 5-1.5 MG/5ML syrup Take 5 mLs by mouth every 6 (six) hours as needed for cough. (Patient not taking: Reported on 07/20/2023) 90 mL 0   No current facility-administered medications on file prior to visit.    Allergies  Allergen Reactions   Pollen Extract Other (See Comments)    Seasonal allergies    Social History   Socioeconomic History   Marital status: Single    Spouse name: Not on file   Number of children: Not on file   Years of education: Not on file   Highest education level: Some college, no degree   Occupational History   Not on file  Tobacco Use   Smoking status: Never   Smokeless tobacco: Never  Vaping Use   Vaping status: Never Used  Substance and Sexual Activity   Alcohol use: Not Currently   Drug use: No   Sexual activity: Yes    Partners: Male    Birth control/protection: None, Condom    Comment: 1st intercourse 38 yo-5 partners  Other Topics Concern   Not on file  Social History Narrative   Not on file   Social Drivers of Health   Financial Resource Strain: Medium Risk (07/16/2023)   Overall Financial Resource Strain (CARDIA)    Difficulty of Paying Living Expenses: Somewhat hard  Food Insecurity: No Food Insecurity (07/16/2023)   Hunger Vital Sign    Worried About Running Out of Food in the Last Year: Never true    Ran Out of Food in the Last Year: Never true  Transportation Needs: No Transportation Needs (07/16/2023)   PRAPARE - Administrator, Civil Service (Medical): No    Lack of Transportation (Non-Medical): No  Physical Activity: Unknown (07/16/2023)   Exercise Vital Sign    Days of Exercise per Week: 0 days    Minutes of Exercise per Session: Not on file  Stress: Stress Concern Present (07/16/2023)   [employer] of Occupational Health - Occupational Stress Questionnaire    Feeling of Stress : To some extent  Social Connections: Socially Isolated (07/16/2023)  Social Connection and Isolation Panel [NHANES]    Frequency of Communication with Friends and Family: Once a week    Frequency of Social Gatherings with Friends and Family: Once a week    Attends Religious Services: More than 4 times per year    Active Member of [employer] or Organizations: No    Attends Engineer, structural: Not on file    Marital Status: Never married  Intimate Partner Violence: Not At Risk (08/17/2023)   Humiliation, Afraid, Rape, and Kick questionnaire    Fear of Current or Ex-Partner: No    Emotionally Abused: No    Physically Abused: No    Sexually  Abused: No    Family History  Problem Relation Age of Onset   Hypertension Mother    Hypertension Father    Stroke Father    Cancer Maternal Grandfather        prostate   Cancer Paternal Grandfather        colon    Past Surgical History:  Procedure Laterality Date   CESAREAN SECTION N/A 08/21/2021   Procedure: CESAREAN SECTION;  Surgeon: Adam Phenix, MD;  Location: [facility] ORS;  Service: Obstetrics;  Laterality: N/A;   NO PAST SURGERIES      ROS: Review of Systems Negative except as stated above  PHYSICAL EXAM: BP (!) 145/91   Pulse 94   Temp 99.1 F (37.3 C) (Oral)   Resp 16   Ht 5\' 5"  (1.651 m)   Wt 205 lb 6.4 oz (93.2 kg)   LMP 08/03/2023 (Approximate)   SpO2 96%   BMI 34.18 kg/m   Physical Exam HENT:     Head: Normocephalic and atraumatic.     Nose: Nose normal.     Mouth/Throat:     Mouth: Mucous membranes are moist.     Pharynx: Oropharynx is clear.  Eyes:     Extraocular Movements: Extraocular movements intact.     Conjunctiva/sclera: Conjunctivae normal.     Pupils: Pupils are equal, round, and reactive to light.  Cardiovascular:     Rate and Rhythm: Normal rate and regular rhythm.     Pulses: Normal pulses.     Heart sounds: Normal heart sounds.  Pulmonary:     Effort: Pulmonary effort is normal.     Breath sounds: Normal breath sounds.  Musculoskeletal:        General: Normal range of motion.     Cervical back: Normal range of motion and neck supple.  Neurological:     General: No focal deficit present.     Mental Status: She is alert and oriented to person, place, and time.  Psychiatric:        Mood and Affect: Mood normal.        Behavior: Behavior normal.     ASSESSMENT AND PLAN: 1. Primary hypertension (Primary) - Blood pressure not at goal during today's visit. Patient asymptomatic without chest pressure, chest pain, palpitations, shortness of breath, worst headache of life, and any additional red flag symptoms. - Increase  Hydrochlorothiazide from 25 mg to 50 mg as prescribed. - Counseled on blood pressure goal of less than 130/80, low-sodium, DASH diet, medication compliance, and 150 minutes of moderate intensity exercise per week as tolerated. Counseled on medication adherence and adverse effects. - Follow-up with primary provider in 4 weeks or sooner if needed.  - hydrochlorothiazide (HYDRODIURIL) 50 MG tablet; Take 1 tablet (50 mg total) by mouth daily.  Dispense: 90 tablet; Refill: 0  2. Perennial allergic rhinitis - Fexofenadine as prescribed. Counseled on medication adherence/adverse effects.  - Follow-up with primary provider as scheduled. - fexofenadine (ALLEGRA) 60 MG tablet; Take 1 tablet (60 mg total) by mouth daily.  Dispense: 90 tablet; Refill: 0  3. Hand edema - Referral to Orthopedic Surgery for evaluation/management. - Ambulatory referral to Orthopedic Surgery  4. Dry scalp - Referral to Dermatology for evaluation/management. - Ambulatory referral to Dermatology   Patient was given the opportunity to ask questions.  Patient verbalized understanding of the plan and was able to repeat key elements of the plan. Patient was given clear instructions to go to Emergency Department or return to medical center if symptoms don't improve, worsen, or new problems develop.The patient verbalized understanding.   Orders Placed This Encounter  Procedures   Ambulatory referral to Orthopedic Surgery   Ambulatory referral to Dermatology     Requested Prescriptions   Signed Prescriptions Disp Refills   hydrochlorothiazide (HYDRODIURIL) 50 MG tablet 90 tablet 0    Sig: Take 1 tablet (50 mg total) by mouth daily.   fexofenadine (ALLEGRA) 60 MG tablet 90 tablet 0    Sig: Take 1 tablet (60 mg total) by mouth daily.    Return in about 4 weeks (around 09/14/2023) for Follow-Up or next available chronic conditions.  Senaida Dama, NP

## 2023-08-17 NOTE — Progress Notes (Signed)
 Follow up on blood pressure.  Referral to dermatologist,having sinus issues

## 2023-08-30 ENCOUNTER — Other Ambulatory Visit (HOSPITAL_COMMUNITY)
Admission: RE | Admit: 2023-08-30 | Discharge: 2023-08-30 | Disposition: A | Discharge status: Home / Self Care | Source: Ambulatory Visit | Attending: Family Medicine | Admitting: Family Medicine

## 2023-08-30 ENCOUNTER — Ambulatory Visit: Payer: Managed Care, Other (non HMO) | Admitting: Family Medicine

## 2023-08-30 VITALS — BP 145/91 | HR 86 | Wt 207.0 lb

## 2023-08-30 DIAGNOSIS — Z01419 Encounter for gynecological examination (general) (routine) without abnormal findings: Secondary | ICD-10-CM

## 2023-08-30 DIAGNOSIS — R87619 Unspecified abnormal cytological findings in specimens from cervix uteri: Secondary | ICD-10-CM | POA: Diagnosis not present

## 2023-08-30 DIAGNOSIS — Z1339 Encounter for screening examination for other mental health and behavioral disorders: Secondary | ICD-10-CM | POA: Diagnosis not present

## 2023-08-30 DIAGNOSIS — L910 Hypertrophic scar: Secondary | ICD-10-CM

## 2023-08-30 NOTE — Progress Notes (Signed)
 ANNUAL EXAM Patient name: Tara Chase MRN [id number]  Date of birth: [birth year] Chief Complaint:   Annual Exam  History of Present Illness:   Tara Chase is a 28 y.o.  [id number]  female  being seen today for a routine annual exam.  Current complaints: last seen 2 weeks ago with daily break through bleeding that started in December. She started COCs and finally stopped 2 weeks ago. Has gained a little more weight with the COCs. Is wondering if the IUD is also causing her to gain weight.   Has itchy area on her c/section scar.  Patient's last menstrual period was 08/03/2023 (approximate).   Upstream - 08/30/23 1037       Pregnancy Intention Screening   Does the patient want to become pregnant in the next year? --    Does the patient's partner want to become pregnant in the next year? --    Would the patient like to discuss contraceptive options today? --      Contraception Wrap Up   Current Method --    End Method --    Contraception Counseling Provided --    How was the end contraceptive method provided? --             Last pap 09/2021. Results were: ASCUS w/ HRHPV negative. H/O abnormal pap: yes LSIL in 2017 Last mammogram: n/a.     08/30/2023   10:35 AM 08/17/2023   11:02 AM 11/03/2021    9:56 AM 10/18/2021   10:23 AM 04/28/2021   11:44 AM  Depression screen PHQ 2/9  Decreased Interest 0 0 0 1 0  Down, Depressed, Hopeless 0 0 1 1 0  PHQ - 2 Score 0 0 1 2 0  Altered sleeping 0 1 0 0   Tired, decreased energy 1 1 1 3    Change in appetite 1 0 0 0   Feeling bad or failure about yourself  0 0 0 0   Trouble concentrating 0 0 0 0   Moving slowly or fidgety/restless 0 0 0 0   Suicidal thoughts 0 0 0 0   PHQ-9 Score 2 2 2 5    Difficult doing work/chores  Somewhat difficult           08/30/2023   10:35 AM 08/17/2023   11:02 AM 11/03/2021    9:58 AM 10/18/2021   10:26 AM  GAD 7 : Generalized Anxiety Score  Nervous, Anxious, on Edge 1 1 1 1   Control/stop  worrying 0 0 0 0  Worry too much - different things 1 1 3 2   Trouble relaxing 0 0 1 0  Restless 0 0 0 0  Easily annoyed or irritable 0 0 1 3  Afraid - awful might happen 0 0 0 0  Total GAD 7 Score 2 2 6 6   Anxiety Difficulty  Somewhat difficult       Review of Systems:   Pertinent items are noted in HPI Denies any headaches, blurred vision, fatigue, shortness of breath, chest pain, abdominal pain, abnormal vaginal discharge/itching/odor/irritation, problems with periods, bowel movements, urination, or intercourse unless otherwise stated above. Pertinent History Reviewed:  Reviewed past medical,surgical, social and family history.  Reviewed problem list, medications and allergies. Physical Assessment:   Vitals:   08/30/23 1024 08/30/23 1031  BP: (!) 138/92 (!) 145/91  Pulse: 87 86  Weight: 207 lb (93.9 kg)   Body mass index is 34.45 kg/m.        Physical Examination:  General appearance - well appearing, and in no distress  Mental status - alert, oriented to person, place, and time  Psych:  She has a normal mood and affect  Skin - warm and dry, normal color, no suspicious lesions noted  Chest - effort normal, all lung fields clear to auscultation bilaterally  Heart - normal rate and regular rhythm  Neck:  midline trachea, no thyromegaly or nodules  Breasts - breasts appear normal, no suspicious masses, no skin or nipple changes or axillary nodes  Abdomen - soft, nontender, nondistended, no masses or organomegaly. Keloid scar of her c/section scar  Pelvic - VULVA: normal appearing vulva with no masses, tenderness or lesions  VAGINA: normal appearing vagina with normal color and discharge, no lesions  CERVIX: normal appearing cervix without discharge or lesions, no CMT  Thin prep pap is done with HR HPV cotesting  UTERUS: uterus is felt to be normal size, shape, consistency and nontender   ADNEXA: No adnexal masses or tenderness noted.  Extremities:  No swelling or varicosities  noted  Chaperone present for exam  Assessment & Plan:  1. Well woman exam with routine gynecological exam (Primary) - Cytology - PAP( [place])  2. Abnormal cervical Papanicolaou smear, unspecified abnormal pap finding PAP today. If neg HPV, resume normal screening.  3. Keloid scar of skin Will arrange steroid injections    Labs/procedures today:   No orders of the defined types were placed in this encounter.   Meds: No orders of the defined types were placed in this encounter.   Follow-up: No follow-ups on file.  Livana Yerian J Bette Brienza, DO 08/30/2023 11:25 AM

## 2023-09-04 LAB — CYTOLOGY - PAP
Adequacy: ABSENT
Diagnosis: NEGATIVE
Diagnosis: REACTIVE

## 2023-09-05 ENCOUNTER — Encounter: Payer: Self-pay | Admitting: Family Medicine

## 2023-09-27 ENCOUNTER — Ambulatory Visit: Admitting: Family Medicine

## 2023-09-27 VITALS — BP 152/106 | HR 83 | Wt 210.0 lb

## 2023-09-27 DIAGNOSIS — L910 Hypertrophic scar: Secondary | ICD-10-CM

## 2023-09-27 NOTE — Progress Notes (Signed)
 Keloid injections  Has cesarean scar keloid with itching and pain.  2.5cm keloid just right of center. 6cm keloid on left.  Injected with 1mL of kenolog and 1 mL lidocoid 2%. Bandaid placed on top.

## 2023-10-25 ENCOUNTER — Ambulatory Visit: Admitting: Family Medicine

## 2023-10-25 VITALS — BP 128/80 | HR 88 | Wt 208.0 lb

## 2023-10-25 DIAGNOSIS — Z30432 Encounter for removal of intrauterine contraceptive device: Secondary | ICD-10-CM

## 2023-10-25 DIAGNOSIS — L910 Hypertrophic scar: Secondary | ICD-10-CM

## 2023-10-25 DIAGNOSIS — N921 Excessive and frequent menstruation with irregular cycle: Secondary | ICD-10-CM | POA: Diagnosis not present

## 2023-10-25 NOTE — Progress Notes (Signed)
 IUD removed due to ongoing bleeding problems.  She has been having breakthrough bleeding for the past few months without improvement on contraception.  IUD Removal  Patient was in the dorsal lithotomy position, normal external genitalia was noted.  A speculum was placed in the patient's vagina, normal discharge was noted, no lesions. The multiparous cervix was visualized, no lesions, no abnormal discharge.  The strings of the IUD was grasped and pulled using ring forceps.  The IUD was successfully removed in its entirety.  Patient tolerated the procedure well.     Keloid injection Has cesarean scar keloid with itching and pain.  This is improved from previous: Keloids appear softer and slightly smaller. 2.5cm keloid just right of center. 6cm keloid on left.  Injected with 1mL of kenolog and 1 mL lidocoid 2%. Bandaid placed on top.

## 2023-11-29 ENCOUNTER — Encounter: Payer: Self-pay | Admitting: Family Medicine

## 2023-12-16 ENCOUNTER — Other Ambulatory Visit: Payer: Self-pay | Admitting: Family

## 2023-12-16 DIAGNOSIS — I10 Essential (primary) hypertension: Secondary | ICD-10-CM

## 2023-12-19 NOTE — Telephone Encounter (Signed)
 Complete

## 2023-12-27 ENCOUNTER — Ambulatory Visit: Admitting: Family Medicine

## 2023-12-27 VITALS — BP 149/98 | HR 75 | Wt 207.0 lb

## 2023-12-27 DIAGNOSIS — L910 Hypertrophic scar: Secondary | ICD-10-CM

## 2023-12-27 NOTE — Progress Notes (Signed)
 Keloid injection Has cesarean scar keloid with itching and pain.  This is improved from previous: Keloids appear softer and smaller. Scar in the middle is flat with just the soft keloid scar on the left side measuring about 6cm keloid.  Injected with 1mL of kenolog and 1 mL lidocoid 2%. Bandaid placed on top

## 2024-02-16 ENCOUNTER — Ambulatory Visit: Admitting: Family

## 2024-02-16 ENCOUNTER — Encounter: Payer: Self-pay | Admitting: Family

## 2024-02-16 VITALS — BP 132/87 | HR 80 | Temp 98.3°F | Resp 16 | Ht 65.0 in | Wt 201.6 lb

## 2024-02-16 DIAGNOSIS — Z23 Encounter for immunization: Secondary | ICD-10-CM

## 2024-02-16 DIAGNOSIS — M79643 Pain in unspecified hand: Secondary | ICD-10-CM

## 2024-02-16 DIAGNOSIS — I10 Essential (primary) hypertension: Secondary | ICD-10-CM | POA: Diagnosis not present

## 2024-02-16 DIAGNOSIS — R238 Other skin changes: Secondary | ICD-10-CM

## 2024-02-16 MED ORDER — HYDROCHLOROTHIAZIDE 50 MG PO TABS: 50.0000 mg | ORAL_TABLET | Freq: Every day | ORAL | 0 refills | Status: DC

## 2024-02-16 NOTE — Progress Notes (Signed)
Follow up for bp

## 2024-02-16 NOTE — Progress Notes (Signed)
 Patient ID: Tara Chase, female    DOB: 1995/08/30  MRN: [id number]  CC: Chronic Conditions Follow-Up  Subjective: Margit Batte is a 28 y.o. female who presents for chronic conditions follow-up.   Her concerns today include:  - Doing well on Hydrochlorothiazide , no issues/concerns. She does not complain of red flag symptoms such as but not limited to chest pain, shortness of breath, worst headache of life, nausea/vomiting.  - States she never received a call from Orthopedics and Dermatology.   Patient Active Problem List   Diagnosis Date Noted   Low grade squamous intraepithelial lesion (LGSIL) on cervical Pap smear 02/02/2016     Current Outpatient Medications on File Prior to Visit  Medication Sig Dispense Refill   albuterol  (VENTOLIN  HFA) 108 (90 Base) MCG/ACT inhaler Inhale 2 puffs into the lungs every 6 (six) hours as needed.     amLODipine  (NORVASC ) 10 MG tablet Take 1 tablet (10 mg total) by mouth daily. 90 tablet 0   fexofenadine  (ALLEGRA ) 60 MG tablet Take 1 tablet (60 mg total) by mouth daily. 90 tablet 0   hydrochlorothiazide  (MICROZIDE ) 12.5 MG capsule Take 12.5 mg by mouth daily. (Patient not taking: Reported on 12/27/2023)     HYDROcodone  bit-homatropine (HYCODAN) 5-1.5 MG/5ML syrup Take 5 mLs by mouth every 6 (six) hours as needed for cough. (Patient not taking: Reported on 12/27/2023) 90 mL 0   No current facility-administered medications on file prior to visit.    Allergies  Allergen Reactions   Pollen Extract Other (See Comments)    Seasonal allergies    Social History   Socioeconomic History   Marital status: Single    Spouse name: Not on file   Number of children: Not on file   Years of education: Not on file   Highest education level: Some college, no degree  Occupational History   Not on file  Tobacco Use   Smoking status: Never   Smokeless tobacco: Never  Vaping Use   Vaping status: Never Used  Substance and Sexual Activity   Alcohol  use: Not Currently   Drug use: No   Sexual activity: Yes    Partners: Male    Birth control/protection: None, Condom    Comment: 1st intercourse 63 yo-5 partners  Other Topics Concern   Not on file  Social History Narrative   Not on file   Social Drivers of Health   Financial Resource Strain: Low Risk  (02/13/2024)   Overall Financial Resource Strain (CARDIA)    Difficulty of Paying Living Expenses: Not very hard  Food Insecurity: No Food Insecurity (02/13/2024)   Hunger Vital Sign    Worried About Running Out of Food in the Last Year: Never true    Ran Out of Food in the Last Year: Never true  Transportation Needs: No Transportation Needs (02/13/2024)   PRAPARE - Administrator, Civil Service (Medical): No    Lack of Transportation (Non-Medical): No  Physical Activity: Inactive (02/13/2024)   Exercise Vital Sign    Days of Exercise per Week: 0 days    Minutes of Exercise per Session: Not on file  Stress: Stress Concern Present (02/13/2024)   [employer] of Occupational Health - Occupational Stress Questionnaire    Feeling of Stress: To some extent  Social Connections: Socially Isolated (02/13/2024)   Social Connection and Isolation Panel    Frequency of Communication with Friends and Family: Once a week    Frequency of Social Gatherings with  Friends and Family: Once a week    Attends Religious Services: More than 4 times per year    Active Member of Clubs or Organizations: No    Attends Engineer, structural: Not on file    Marital Status: Never married  Intimate Partner Violence: Not At Risk (08/17/2023)   Humiliation, Afraid, Rape, and Kick questionnaire    Fear of Current or Ex-Partner: No    Emotionally Abused: No    Physically Abused: No    Sexually Abused: No    Family History  Problem Relation Age of Onset   Hypertension Mother    Hypertension Father    Stroke Father    Cancer Maternal Grandfather        prostate   Cancer Paternal  Grandfather        colon    Past Surgical History:  Procedure Laterality Date   CESAREAN SECTION N/A 08/21/2021   Procedure: CESAREAN SECTION;  Surgeon: Eveline Lynwood MATSU, MD;  Location: [facility];  Service: Obstetrics;  Laterality: N/A;   NO PAST SURGERIES      ROS: Review of Systems Negative except as stated above  PHYSICAL EXAM: BP 132/87   Pulse 80   Temp 98.3 F (36.8 C) (Oral)   Resp 16   Ht 5' 5 (1.651 m)   Wt 201 lb 9.6 oz (91.4 kg)   LMP 02/02/2024 (Approximate)   SpO2 97%   BMI 33.55 kg/m   Physical Exam HENT:     Head: Normocephalic and atraumatic.     Nose: Nose normal.     Mouth/Throat:     Mouth: Mucous membranes are moist.     Pharynx: Oropharynx is clear.  Eyes:     Extraocular Movements: Extraocular movements intact.     Conjunctiva/sclera: Conjunctivae normal.     Pupils: Pupils are equal, round, and reactive to light.  Cardiovascular:     Rate and Rhythm: Normal rate and regular rhythm.     Pulses: Normal pulses.     Heart sounds: Normal heart sounds.  Pulmonary:     Effort: Pulmonary effort is normal.     Breath sounds: Normal breath sounds.  Musculoskeletal:        General: Normal range of motion.     Cervical back: Normal range of motion and neck supple.  Neurological:     General: No focal deficit present.     Mental Status: She is alert and oriented to person, place, and time.  Psychiatric:        Mood and Affect: Mood normal.        Behavior: Behavior normal.     ASSESSMENT AND PLAN: 1. Primary hypertension (Primary) - Continue Hydrochlorothiazide  as prescribed.  - Counseled on blood pressure goal of less than 130/80, low-sodium, DASH diet, medication compliance, and 150 minutes of moderate intensity exercise per week as tolerated. Counseled on medication adherence and adverse effects. - Follow-up with primary provider in 3 months or sooner if needed.  - hydrochlorothiazide  (HYDRODIURIL ) 50 MG tablet; Take 1 tablet (50 mg total) by  mouth daily.  Dispense: 90 tablet; Refill: 0  2. Pain of hand, unspecified laterality - Referral to Orthopedic Surgery for evaluation/management. - Ambulatory referral to Orthopedic Surgery  3. Dry scalp - Referral to Dermatology for evaluation/management. - Ambulatory referral to Dermatology  4. Immunization due - Administered.  - Flu vaccine trivalent PF, 6mos and older(Flulaval,Afluria,Fluarix,Fluzone)   Patient was given the opportunity to ask questions.  Patient verbalized understanding  of the plan and was able to repeat key elements of the plan. Patient was given clear instructions to go to Emergency Department or return to medical center if symptoms don't improve, worsen, or new problems develop.The patient verbalized understanding.   Orders Placed This Encounter  Procedures   Flu vaccine trivalent PF, 6mos and older(Flulaval,Afluria,Fluarix,Fluzone)   Ambulatory referral to Orthopedic Surgery   Ambulatory referral to Dermatology     Requested Prescriptions   Signed Prescriptions Disp Refills   hydrochlorothiazide  (HYDRODIURIL ) 50 MG tablet 90 tablet 0    Sig: Take 1 tablet (50 mg total) by mouth daily.    Return in about 3 months (around 05/18/2024) for Follow-Up or next available chronic conditions.  Greig [name] Drones, NP

## 2024-03-04 ENCOUNTER — Encounter: Payer: Self-pay | Admitting: Radiology

## 2024-03-09 NOTE — Progress Notes (Unsigned)
 Tara Chase - 28 y.o. female MRN [id number]  Date of birth: 05/19/1995  Office Visit Note: Visit Date: 03/11/2024 PCP: Jaycee Greig [name], NP Referred by: Jaycee Greig [name], NP  Subjective: No chief complaint on file.  HPI: Tara Chase is a pleasant 28 y.o. female who presents today for evaluation of right hand pain and swelling that happens periodically without any significant inciting incident.  States that it is not occurring on today's visit.  She is unable to delineate any specific source.  She has been worked up in the past she states by her PCP for potential rheumatoid or autoimmune pathology with negative results.  Does not have any significant family history that she is aware of of autoimmune condition.  Pertinent ROS were reviewed with the patient and found to be negative unless otherwise specified above in HPI.   Visit Reason:right hand swelling and pain  Duration of symptoms: 5 years Hand dominance: right Occupation: [employer] Diabetic: No Smoking: No Heart/Lung History: none Blood Thinners: none  Prior Testing/EMG: none Injections (Date): none Treatments: ibuprofen  Prior Surgery: none   Assessment & Plan: Visit Diagnoses:  1. Pain in right hand     Plan: Based on her examination today, I do not see any structural abnormalities that would warrant aggressive workup or treatment.  She has appropriate range of motion and strength on examination today.  I did explain to her that should symptoms recur in the future, she is welcome to return to me for repeat evaluation.  X-rays were reviewed today of the right hand which do not show any significant degenerative changes or bony abnormalities.  I did explain that should symptoms persist in the future, we could always consider right hand and wrist MRI for further workup.  Follow-up: No follow-ups on file.   Meds & Orders: No orders of the defined types were placed in this encounter.   Orders Placed This Encounter   Procedures   XR Hand Complete Right     Procedures: No procedures performed      Clinical History: No specialty comments available.  She reports that she has never smoked. She has never used smokeless tobacco. No results for input(s): HGBA1C, LABURIC in the last 8760 hours.  Objective:   Vital Signs: LMP 02/02/2024 (Approximate)   Physical Exam  Gen: Well-appearing, in no acute distress; non-toxic CV: Regular Rate. Well-perfused. Warm.  Resp: Breathing unlabored on room air; no wheezing. Psych: Fluid speech in conversation; appropriate affect; normal thought process  Ortho Exam PHYSICAL EXAM:  General: Patient is well appearing and in no distress.  Skin and Muscle: No significant skin changes are apparent to hands.  Muscle bulk and contour normal, no signs of atrophy.     Range of Motion and Palpation Tests: Mobility is full about the elbows with flexion and extension.  Forearm supination and pronation are 85/85 bilaterally.  Wrist flexion/extension is 75/65 bilaterally.  Digital flexion and extension are full.  Thumb opposition is full to the base of the small fingers bilaterally.    No cords or nodules are palpated.  No triggering is observed.    Finklestein test is negative bilateral.  Ulnar impingement test is bilateral.  No evidence of radiocarpal, midcarpal or intercarpal joint instability with provocation.  Neurologic, Vascular, Motor: Sensation is intact to light touch in the median/radial/ulnar distributions.   Fingers pink and well perfused.  Capillary refill is brisk.      No results found for: HGBA1C    Imaging:  No results found.  Past Medical/Family/Surgical/Social History: Medications & Allergies reviewed per EMR, new medications updated. Patient Active Problem List   Diagnosis Date Noted   Low grade squamous intraepithelial lesion (LGSIL) on cervical Pap smear 02/02/2016   Past Medical History:  Diagnosis Date   Asthma    Gestational  diabetes    Hypertension    Family History  Problem Relation Age of Onset   Hypertension Mother    Hypertension Father    Stroke Father    Cancer Maternal Grandfather        prostate   Cancer Paternal Grandfather        colon   Past Surgical History:  Procedure Laterality Date   CESAREAN SECTION N/A 08/21/2021   Procedure: CESAREAN SECTION;  Surgeon: Eveline Lynwood MATSU, MD;  Location: [facility];  Service: Obstetrics;  Laterality: N/A;   NO PAST SURGERIES     Social History   Occupational History   Not on file  Tobacco Use   Smoking status: Never   Smokeless tobacco: Never  Vaping Use   Vaping status: Never Used  Substance and Sexual Activity   Alcohol use: Not Currently   Drug use: No   Sexual activity: Yes    Partners: Male    Birth control/protection: None, Condom    Comment: 1st intercourse 34 yo-5 partners    Jakelyn Squyres Estela) Arlinda, M.D. [facility], Hand Surgery

## 2024-03-11 ENCOUNTER — Ambulatory Visit: Admitting: Orthopedic Surgery

## 2024-03-11 ENCOUNTER — Other Ambulatory Visit: Payer: Self-pay

## 2024-03-11 DIAGNOSIS — M79641 Pain in right hand: Secondary | ICD-10-CM | POA: Diagnosis not present

## 2024-05-21 ENCOUNTER — Ambulatory Visit: Admitting: Family

## 2024-05-21 VITALS — BP 138/89 | HR 78 | Ht 65.0 in | Wt 213.0 lb

## 2024-05-21 DIAGNOSIS — R635 Abnormal weight gain: Secondary | ICD-10-CM | POA: Diagnosis not present

## 2024-05-21 DIAGNOSIS — I10 Essential (primary) hypertension: Secondary | ICD-10-CM | POA: Diagnosis not present

## 2024-05-21 MED ORDER — HYDROCHLOROTHIAZIDE 50 MG PO TABS: 50.0000 mg | ORAL_TABLET | Freq: Every day | ORAL | 0 refills | Status: AC

## 2024-05-21 NOTE — Progress Notes (Signed)
 "   Patient ID: Tara Chase, female    DOB: [birth year]  MRN: [id number]  CC: Chronic Conditions Follow-Up  Subjective: Tara Chase is a 29 y.o. female who presents for chronic conditions follow-up.   Her concerns today include:  - Doing well on Hydrochlorothiazide , no issues/concerns. She does not complain of red flag symptoms such as but not limited to chest pain, shortness of breath, worst headache of life, nausea/vomiting.  - Weight gain. States she could use some improvement with diet and exercise.  - Established with Dermatology.  - States she was seen by Orthopedics and was told everything was ok. She plans to schedule a second opinion referral within the same Orthopedics office. Patient aware to follow-up with primary provider as scheduled.   Patient Active Problem List   Diagnosis Date Noted   Low grade squamous intraepithelial lesion (LGSIL) on cervical Pap smear 02/02/2016     Medications Ordered Prior to Encounter[1]  Allergies[2]  Social History   Socioeconomic History   Marital status: Single    Spouse name: Not on file   Number of children: Not on file   Years of education: Not on file   Highest education level: Some college, no degree  Occupational History   Not on file  Tobacco Use   Smoking status: Never   Smokeless tobacco: Never  Vaping Use   Vaping status: Never Used  Substance and Sexual Activity   Alcohol use: Not Currently   Drug use: No   Sexual activity: Yes    Partners: Male    Birth control/protection: None, Condom    Comment: 1st intercourse 24 yo-5 partners  Other Topics Concern   Not on file  Social History Narrative   Not on file   Social Drivers of Health   Tobacco Use: Low Risk (03/25/2024)   Received from [facility]   Patient History    Smoking Tobacco Use: Never    Smokeless Tobacco Use: Never    Passive Exposure: Not on file  Financial Resource Strain: Low Risk (02/13/2024)   Overall Financial Resource Strain  (CARDIA)    Difficulty of Paying Living Expenses: Not very hard  Food Insecurity: No Food Insecurity (02/13/2024)   Epic    Worried About Radiation Protection Practitioner of Food in the Last Year: Never true    Ran Out of Food in the Last Year: Never true  Transportation Needs: No Transportation Needs (02/13/2024)   Epic    Lack of Transportation (Medical): No    Lack of Transportation (Non-Medical): No  Physical Activity: Inactive (02/13/2024)   Exercise Vital Sign    Days of Exercise per Week: 0 days    Minutes of Exercise per Session: Not on file  Stress: Stress Concern Present (02/13/2024)   [employer] of Occupational Health - Occupational Stress Questionnaire    Feeling of Stress: To some extent  Social Connections: Socially Isolated (02/13/2024)   Social Connection and Isolation Panel    Frequency of Communication with Friends and Family: Once a week    Frequency of Social Gatherings with Friends and Family: Once a week    Attends Religious Services: More than 4 times per year    Active Member of [employer] or Organizations: No    Attends Banker Meetings: Not on file    Marital Status: Never married  Intimate Partner Violence: Not At Risk (08/17/2023)   Humiliation, Afraid, Rape, and Kick questionnaire    Fear of Current or Ex-Partner: No  Emotionally Abused: No    Physically Abused: No    Sexually Abused: No  Depression (PHQ2-9): Low Risk (02/16/2024)   Depression (PHQ2-9)    PHQ-2 Score: 0  Alcohol Screen: Low Risk (02/13/2024)   Alcohol Screen    Last Alcohol Screening Score (AUDIT): 1  Housing: High Risk (02/13/2024)   Epic    Unable to Pay for Housing in the Last Year: Yes    Number of Times Moved in the Last Year: 0    Homeless in the Last Year: No  Utilities: Not At Risk (08/17/2023)   [facility] Utilities    Threatened with loss of utilities: No  Health Literacy: Adequate Health Literacy (08/17/2023)   B1300 Health Literacy    Frequency of need for help with medical  instructions: Never    Family History  Problem Relation Age of Onset   Hypertension Mother    Hypertension Father    Stroke Father    Cancer Maternal Grandfather        prostate   Cancer Paternal Grandfather        colon    Past Surgical History:  Procedure Laterality Date   CESAREAN SECTION N/A 08/21/2021   Procedure: CESAREAN SECTION;  Surgeon: Eveline Lynwood MATSU, MD;  Location: [facility];  Service: Obstetrics;  Laterality: N/A;   NO PAST SURGERIES      ROS: Review of Systems Negative except as stated above  PHYSICAL EXAM: BP 138/89   Pulse 78   Ht 5' 5 (1.651 m)   Wt 213 lb (96.6 kg)   SpO2 98%   BMI 35.45 kg/m   Wt Readings from Last 3 Encounters:  05/21/24 213 lb (96.6 kg)  02/16/24 201 lb 9.6 oz (91.4 kg)  12/27/23 207 lb (93.9 kg)    Physical Exam HENT:     Head: Normocephalic and atraumatic.     Nose: Nose normal.     Mouth/Throat:     Mouth: Mucous membranes are moist.     Pharynx: Oropharynx is clear.  Eyes:     Extraocular Movements: Extraocular movements intact.     Conjunctiva/sclera: Conjunctivae normal.     Pupils: Pupils are equal, round, and reactive to light.  Cardiovascular:     Rate and Rhythm: Normal rate and regular rhythm.     Pulses: Normal pulses.     Heart sounds: Normal heart sounds.  Pulmonary:     Effort: Pulmonary effort is normal.     Breath sounds: Normal breath sounds.  Musculoskeletal:        General: Normal range of motion.     Cervical back: Normal range of motion and neck supple.  Neurological:     General: No focal deficit present.     Mental Status: She is alert and oriented to person, place, and time.  Psychiatric:        Mood and Affect: Mood normal.        Behavior: Behavior normal.     ASSESSMENT AND PLAN: 1. Primary hypertension (Primary) - Continue Hydrochlorothiazide  as prescribed.  - Routine screening.  - Counseled on blood pressure goal of less than 130/80, low-sodium, DASH diet, medication  compliance, and 150 minutes of moderate intensity exercise per week as tolerated. Counseled on medication adherence and adverse effects. - Follow-up with primary provider in 3 months or sooner if needed.  - hydrochlorothiazide  (HYDRODIURIL ) 50 MG tablet; Take 1 tablet (50 mg total) by mouth daily.  Dispense: 90 tablet; Refill: 0 - Basic Metabolic Panel  2. Weight gain - Patient declined pharmacological therapy.  - Patient declined referral to Medical Weight Management.   Patient was given the opportunity to ask questions.  Patient verbalized understanding of the plan and was able to repeat key elements of the plan. Patient was given clear instructions to go to Emergency Department or return to medical center if symptoms don't improve, worsen, or new problems develop.The patient verbalized understanding.   Orders Placed This Encounter  Procedures   Basic Metabolic Panel     Requested Prescriptions   Signed Prescriptions Disp Refills   hydrochlorothiazide  (HYDRODIURIL ) 50 MG tablet 90 tablet 0    Sig: Take 1 tablet (50 mg total) by mouth daily.    Return in about 3 months (around 08/19/2024) for Follow-Up or next available chronic conditions.  Greig [name] Chute, NP      [1]  Current Outpatient Medications on File Prior to Visit  Medication Sig Dispense Refill   albuterol  (VENTOLIN  HFA) 108 (90 Base) MCG/ACT inhaler Inhale 2 puffs into the lungs every 6 (six) hours as needed.     amLODipine  (NORVASC ) 10 MG tablet Take 1 tablet (10 mg total) by mouth daily. 90 tablet 0   fexofenadine  (ALLEGRA ) 60 MG tablet Take 1 tablet (60 mg total) by mouth daily. 90 tablet 0   hydrochlorothiazide  (MICROZIDE ) 12.5 MG capsule Take 12.5 mg by mouth daily. (Patient not taking: Reported on 12/27/2023)     HYDROcodone  bit-homatropine (HYCODAN) 5-1.5 MG/5ML syrup Take 5 mLs by mouth every 6 (six) hours as needed for cough. (Patient not taking: Reported on 12/27/2023) 90 mL 0   No current facility-administered  medications on file prior to visit.  [2]  Allergies Allergen Reactions   Bee Pollen Other (See Comments) and Itching   Pollen Extract Other (See Comments)    Seasonal allergies   "

## 2024-05-22 ENCOUNTER — Ambulatory Visit: Payer: Self-pay | Admitting: Family

## 2024-05-22 ENCOUNTER — Encounter: Payer: Self-pay | Admitting: Family Medicine

## 2024-05-22 LAB — BASIC METABOLIC PANEL WITH GFR
BUN/Creatinine Ratio: 12 (ref 9–23)
BUN: 9 mg/dL (ref 6–20)
CO2: 24 mmol/L (ref 20–29)
Calcium: 9.5 mg/dL (ref 8.7–10.2)
Chloride: 101 mmol/L (ref 96–106)
Creatinine, Ser: 0.73 mg/dL (ref 0.57–1.00)
Glucose: 85 mg/dL (ref 70–99)
Potassium: 4.1 mmol/L (ref 3.5–5.2)
Sodium: 140 mmol/L (ref 134–144)
eGFR: 113 mL/min/1.73

## 2024-06-07 ENCOUNTER — Other Ambulatory Visit: Payer: Self-pay | Admitting: Family

## 2024-06-07 DIAGNOSIS — I10 Essential (primary) hypertension: Secondary | ICD-10-CM

## 2024-06-07 NOTE — Telephone Encounter (Signed)
 Copied from CRM #[id number]. Topic: Clinical - Prescription Issue >> Jun 07, 2024 12:53 PM Ivette P wrote: Reason for CRM: Pt called in because insurance is telling her to [facility] and also to provide a 90 day supply of the medication so pt benefit the most form saving money.   hydrochlorothiazide  (HYDRODIURIL ) 50 MG tablet  amLODipine  (NORVASC ) 10 MG tablet

## 2024-08-22 ENCOUNTER — Ambulatory Visit: Payer: Self-pay | Admitting: Family
